# Patient Record
Sex: Male | Born: 1965 | Race: Black or African American | Hispanic: No | Marital: Married | Smoking: Former smoker
Health system: Southern US, Community
[De-identification: ages and names within clinical notes are randomized; demographics above are authoritative.]

## PROBLEM LIST (undated history)

## (undated) DIAGNOSIS — K219 Gastro-esophageal reflux disease without esophagitis: Secondary | ICD-10-CM

## (undated) DIAGNOSIS — G43909 Migraine, unspecified, not intractable, without status migrainosus: Secondary | ICD-10-CM

## (undated) DIAGNOSIS — G4733 Obstructive sleep apnea (adult) (pediatric): Secondary | ICD-10-CM

## (undated) DIAGNOSIS — I10 Essential (primary) hypertension: Secondary | ICD-10-CM

## (undated) DIAGNOSIS — I2510 Atherosclerotic heart disease of native coronary artery without angina pectoris: Secondary | ICD-10-CM

## (undated) HISTORY — PX: HERNIA REPAIR: SHX51

## (undated) HISTORY — PX: OTHER SURGICAL HISTORY: SHX169

## (undated) HISTORY — PX: BILATERAL CARPAL TUNNEL RELEASE: SHX6508

## (undated) HISTORY — PX: CARDIAC CATHETERIZATION: SHX172

## (undated) SURGERY — REPAIR, HERNIA, INGUINAL, BILATERAL, LAPAROSCOPIC
Anesthesia: General | Laterality: Bilateral

---

## 2001-09-19 ENCOUNTER — Emergency Department (HOSPITAL_COMMUNITY): Admission: EM | Admit: 2001-09-19 | Discharge: 2001-09-19 | Payer: Self-pay | Admitting: Emergency Medicine

## 2001-09-19 ENCOUNTER — Encounter: Payer: Self-pay | Admitting: Emergency Medicine

## 2004-04-03 ENCOUNTER — Other Ambulatory Visit: Payer: Self-pay

## 2004-04-04 ENCOUNTER — Other Ambulatory Visit: Payer: Self-pay

## 2005-11-03 ENCOUNTER — Emergency Department: Payer: Self-pay | Admitting: Emergency Medicine

## 2007-05-12 ENCOUNTER — Ambulatory Visit: Payer: Self-pay | Admitting: Family Medicine

## 2008-10-05 ENCOUNTER — Ambulatory Visit: Payer: Self-pay | Admitting: Internal Medicine

## 2009-10-29 ENCOUNTER — Emergency Department: Payer: Self-pay | Admitting: Unknown Physician Specialty

## 2010-01-24 ENCOUNTER — Emergency Department: Payer: Self-pay | Admitting: Emergency Medicine

## 2010-07-20 ENCOUNTER — Emergency Department: Payer: Self-pay | Admitting: Emergency Medicine

## 2010-09-18 ENCOUNTER — Ambulatory Visit: Payer: Self-pay | Admitting: Internal Medicine

## 2010-09-26 ENCOUNTER — Ambulatory Visit: Payer: Self-pay | Admitting: Internal Medicine

## 2012-06-18 LAB — COMPREHENSIVE METABOLIC PANEL
Albumin: 3.3 g/dL — ABNORMAL LOW (ref 3.4–5.0)
Alkaline Phosphatase: 91 U/L (ref 50–136)
Anion Gap: 11 (ref 7–16)
BUN: 20 mg/dL — ABNORMAL HIGH (ref 7–18)
Bilirubin,Total: 0.5 mg/dL (ref 0.2–1.0)
Calcium, Total: 8.4 mg/dL — ABNORMAL LOW (ref 8.5–10.1)
Chloride: 109 mmol/L — ABNORMAL HIGH (ref 98–107)
Co2: 25 mmol/L (ref 21–32)
Creatinine: 1.56 mg/dL — ABNORMAL HIGH (ref 0.60–1.30)
EGFR (African American): >60
EGFR (Non-African Amer.): 52 — ABNORMAL LOW
Glucose: 202 mg/dL — ABNORMAL HIGH (ref 65–99)
Osmolality: 297 (ref 275–301)
Potassium: 3.9 mmol/L (ref 3.5–5.1)
SGOT(AST): 24 U/L (ref 15–37)
SGPT (ALT): 24 U/L (ref 12–78)
Sodium: 145 mmol/L (ref 136–145)
Total Protein: 6.5 g/dL (ref 6.4–8.2)

## 2012-06-18 LAB — CBC
HCT: 42.4 % (ref 40.0–52.0)
HGB: 14.4 g/dL (ref 13.0–18.0)
MCH: 27 pg (ref 26.0–34.0)
MCHC: 34 g/dL (ref 32.0–36.0)
MCV: 79 fL — ABNORMAL LOW (ref 80–100)
Platelet: 222 10*3/uL (ref 150–440)
RBC: 5.34 10*6/uL (ref 4.40–5.90)
RDW: 13.9 % (ref 11.5–14.5)
WBC: 14.9 10*3/uL — ABNORMAL HIGH (ref 3.8–10.6)

## 2012-06-18 LAB — PROTIME-INR
INR: 1.1
Prothrombin Time: 14.3 secs (ref 11.5–14.7)

## 2012-06-18 LAB — CK TOTAL AND CKMB (NOT AT ARMC)
CK, Total: 145 U/L (ref 35–232)
CK-MB: 2 ng/mL (ref 0.5–3.6)

## 2012-06-19 ENCOUNTER — Inpatient Hospital Stay: Payer: Self-pay | Admitting: Internal Medicine

## 2012-06-19 LAB — URINALYSIS, COMPLETE
Bacteria: NONE SEEN
Bilirubin,UR: NEGATIVE
Blood: NEGATIVE
Glucose,UR: 50 mg/dL (ref 0–75)
Hyaline Cast: 4
Ketone: NEGATIVE
Leukocyte Esterase: NEGATIVE
Nitrite: NEGATIVE
Ph: 5 (ref 4.5–8.0)
Protein: NEGATIVE
RBC,UR: 1 /HPF (ref 0–5)
Specific Gravity: 1.025 (ref 1.003–1.030)
Squamous Epithelial: NONE SEEN
WBC UR: 3 /HPF (ref 0–5)

## 2012-06-19 LAB — HEMOGLOBIN A1C: Hemoglobin A1C: 5.9 % (ref 4.2–6.3)

## 2012-06-20 DIAGNOSIS — R0602 Shortness of breath: Secondary | ICD-10-CM

## 2012-06-20 LAB — BASIC METABOLIC PANEL
Anion Gap: 7 (ref 7–16)
BUN: 11 mg/dL (ref 7–18)
Calcium, Total: 8.7 mg/dL (ref 8.5–10.1)
Chloride: 108 mmol/L — ABNORMAL HIGH (ref 98–107)
Co2: 26 mmol/L (ref 21–32)
Creatinine: 0.83 mg/dL (ref 0.60–1.30)
EGFR (African American): >60
EGFR (Non-African Amer.): >60
Glucose: 105 mg/dL — ABNORMAL HIGH (ref 65–99)
Osmolality: 281 (ref 275–301)
Potassium: 3.7 mmol/L (ref 3.5–5.1)
Sodium: 141 mmol/L (ref 136–145)

## 2012-06-20 LAB — LIPID PANEL
Cholesterol: 132 mg/dL (ref 0–200)
HDL Cholesterol: 32 mg/dL — ABNORMAL LOW (ref 40–60)
Ldl Cholesterol, Calc: 66 mg/dL (ref 0–100)
Triglycerides: 171 mg/dL (ref 0–200)
VLDL Cholesterol, Calc: 34 mg/dL (ref 5–40)

## 2012-06-20 LAB — CBC WITH DIFFERENTIAL/PLATELET
Basophil #: 0 10*3/uL (ref 0.0–0.1)
Basophil %: 0.4 %
Eosinophil #: 0.3 10*3/uL (ref 0.0–0.7)
Eosinophil %: 4.1 %
HCT: 36.4 % — ABNORMAL LOW (ref 40.0–52.0)
HGB: 12.6 g/dL — ABNORMAL LOW (ref 13.0–18.0)
Lymphocyte #: 3.3 10*3/uL (ref 1.0–3.6)
Lymphocyte %: 43.8 %
MCH: 27.4 pg (ref 26.0–34.0)
MCHC: 34.5 g/dL (ref 32.0–36.0)
MCV: 80 fL (ref 80–100)
Monocyte #: 0.5 x10 3/mm (ref 0.2–1.0)
Monocyte %: 5.9 %
Neutrophil #: 3.5 10*3/uL (ref 1.4–6.5)
Neutrophil %: 45.8 %
Platelet: 195 10*3/uL (ref 150–440)
RBC: 4.58 10*6/uL (ref 4.40–5.90)
RDW: 13.7 % (ref 11.5–14.5)
WBC: 7.6 10*3/uL (ref 3.8–10.6)

## 2012-12-12 DIAGNOSIS — R413 Other amnesia: Secondary | ICD-10-CM | POA: Insufficient documentation

## 2012-12-12 DIAGNOSIS — R479 Unspecified speech disturbances: Secondary | ICD-10-CM

## 2012-12-12 DIAGNOSIS — F809 Developmental disorder of speech and language, unspecified: Secondary | ICD-10-CM | POA: Insufficient documentation

## 2014-01-05 ENCOUNTER — Emergency Department: Payer: Self-pay | Admitting: Emergency Medicine

## 2014-01-05 LAB — URINALYSIS, COMPLETE
BACTERIA: NONE SEEN
Bilirubin,UR: NEGATIVE
GLUCOSE, UR: NEGATIVE mg/dL (ref 0–75)
Ketone: NEGATIVE
Leukocyte Esterase: NEGATIVE
NITRITE: NEGATIVE
Ph: 5 (ref 4.5–8.0)
Protein: NEGATIVE
RBC,UR: 3 /HPF (ref 0–5)
SQUAMOUS EPITHELIAL: NONE SEEN
Specific Gravity: 1.026 (ref 1.003–1.030)

## 2014-01-05 LAB — CBC WITH DIFFERENTIAL/PLATELET
Basophil #: 0 10*3/uL (ref 0.0–0.1)
Basophil %: 0.6 %
Eosinophil #: 0.2 10*3/uL (ref 0.0–0.7)
Eosinophil %: 2.8 %
HCT: 40.8 % (ref 40.0–52.0)
HGB: 13.6 g/dL (ref 13.0–18.0)
Lymphocyte #: 2.9 10*3/uL (ref 1.0–3.6)
Lymphocyte %: 39.5 %
MCH: 26.6 pg (ref 26.0–34.0)
MCHC: 33.4 g/dL (ref 32.0–36.0)
MCV: 80 fL (ref 80–100)
Monocyte #: 0.7 x10 3/mm (ref 0.2–1.0)
Monocyte %: 9.1 %
Neutrophil #: 3.5 10*3/uL (ref 1.4–6.5)
Neutrophil %: 48 %
Platelet: 203 10*3/uL (ref 150–440)
RBC: 5.13 10*6/uL (ref 4.40–5.90)
RDW: 13.7 % (ref 11.5–14.5)
WBC: 7.4 10*3/uL (ref 3.8–10.6)

## 2014-01-05 LAB — TROPONIN I: Troponin-I: <0.02 ng/mL

## 2014-01-05 LAB — COMPREHENSIVE METABOLIC PANEL
ALBUMIN: 3.7 g/dL (ref 3.4–5.0)
AST: 17 U/L (ref 15–37)
Alkaline Phosphatase: 105 U/L
Anion Gap: 3 — ABNORMAL LOW (ref 7–16)
BUN: 11 mg/dL (ref 7–18)
Bilirubin,Total: 0.6 mg/dL (ref 0.2–1.0)
CHLORIDE: 105 mmol/L (ref 98–107)
Calcium, Total: 9 mg/dL (ref 8.5–10.1)
Co2: 31 mmol/L (ref 21–32)
Creatinine: 0.93 mg/dL (ref 0.60–1.30)
EGFR (Non-African Amer.): >60
Glucose: 108 mg/dL — ABNORMAL HIGH (ref 65–99)
Osmolality: 277 (ref 275–301)
Potassium: 3.8 mmol/L (ref 3.5–5.1)
SGPT (ALT): 24 U/L (ref 12–78)
SODIUM: 139 mmol/L (ref 136–145)
TOTAL PROTEIN: 7.3 g/dL (ref 6.4–8.2)

## 2014-12-07 NOTE — Discharge Summary (Signed)
PATIENT NAME:  Matthew [name], Matthew [name] MR#:  [id number] DATE OF BIRTH:  [birth year]  DATE OF ADMISSION:  06/19/2012 DATE OF DISCHARGE:  06/20/2012  DISCHARGE DIAGNOSIS: Slurred speech and right-sided weakness which are resolved and transient, likely due to transient ischemic attack versus stress reaction.   DISCHARGE MEDICATIONS: Aspirin 81 mg daily.   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 The patient did have a work-up as an outpatient with an allergist in [place] and uses an EpiPen.  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   CONSULTATIONS: Physical Therapy and Speech Therapy.   LABORATORY, DIAGNOSTIC AND RADIOLOGICAL DATA: Hemoglobin A1c 5.9.  Sodium 145, potassium 3.9, chloride 109, bicarbonate 25, BUN 20, creatinine 1.56 on admission and glucose 202. WBC 14.9 on admission, hemoglobin 14.3, hematocrit 42.4, platelets 222. INR 1.9. CT of the head: No acute abnormality. Chest x-ray showed no acute abnormality. MRA of the brain is negative for any acute changes. MRA of the brain showed no acute intracranial abnormalities, no infarct. Ultrasound of carotids showed no evidence of hemodynamically significant stenosis. No significant atherosclerotic disease. LDL 66, HDL 32, triglycerides 171. Creatinine this morning is 0.83. WBC is 7.6 today, hemoglobin 12.6. The patient's echocardiogram shows normal systolic function, normal left ventricular wall motion, and normal right ventricular wall motion. The patient's ejection fraction is 56%.   HOSPITAL COURSE: The patient is a 49 year old male with no past medical history who is relatively in healthy condition and does exercise regularly, came in because of slurred speech and right-sided weakness. Look at the History and Physical for full details. The patient had three miles jogging and then came home. He was feeling hot and went into the shower. The patient had like a syncopal  episode there, and after that the patient was noted to have drooling from the right side of the mouth and right-sided weakness and right facial numbness. The patient's CAT scan was negative in the ER. Neurology was consulted in the ER, and the patient was consulted with the specialist on call in the ER, and he was a candidate for TPA. 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 Speech Therapy and Physical Therapy were consulted. MRI of the brain and carotid ultrasound and echocardiogram were done. They are normal. The patient neurologically remained stable.  The patient's weakness and dysarthria also resolved. The patient's weakness on the right side has resolved completely, and he is able to communicate. The patient had a little bit of slowing when talking, and yesterday he was slow to respond; but today he is completely normal and able to communicate. His speech is more clear, no facial numbness, no weakness of the right or left hand. The patient was seen by Physical Therapy and Speech Therapy. The patient ambulated 250 feet with a rolling walker, and Physical Therapy recommended home with outpatient services. He was also seen by a speech therapist, and she also recommends outpatient speech therapy. The patient says that he is under a lot of stress, has a full-time job and also working. He is studying also in theology. He also has a Charity fundraiser job. He is under a lot of stress, so it can be stress-induced changes as well. If the symptoms worsen, he needs to have a work-up  with MRI of the spine further evaluating for any degenerative disease. The patient says that he follows up with [facility] as needed, so we are trying to set up an appointment with [facility] with outpatient speech therapy and also outpatient physical therapy. The patient  says that he does take creatinine supplements for his exercise. His creatinine was a little bit up, and right now it is normal; and also leukocytosis has improved.   CONDITION: Stable.   DISCHARGE VITAL SIGNS:  Temperature is 97.9, pulse is 54, blood pressure is 126/80, saturating 97% on room air.   TIME SPENT ON DISCHARGE PREPARATION: More than 30 minutes.  ____________________________ Epifanio Lesches, MD sk:cbb D: 06/20/2012 11:07:42 ET T: 06/20/2012 11:24:47 ET JOB#: [id number]  cc: Epifanio Lesches, MD, <Dictator> [facility] Epifanio Lesches MD ELECTRONICALLY SIGNED 07/01/2012 19:47

## 2014-12-07 NOTE — H&P (Signed)
PATIENT NAME:  Matthew Matthew [name], Matthew [name] MR#:  [id number] DATE OF BIRTH:  07/29/1966  DATE OF ADMISSION:  06/19/2012  PRIMARY CARE PHYSICIAN: None local.   CHIEF COMPLAINT: Slurry speech and right-sided weakness.   HISTORY OF PRESENT ILLNESS: The patient is a 49 year old male with a past medical history of coronary artery disease and carpal tunnel repair who is presenting to the ER with a chief complaint of dysarthria and right-sided weakness. The patient was in his usual state of health until yesterday evening. The patient went jogging yesterday evening and following that he changes his clothes and went to take shower and could not stand while taking a shower and he fell. The patient was having dizziness and noticed difficulty in speaking. Sister-in-law has noted drooling from the right side of the mouth. 9-1-1 was called and the patient was brought into the ER. The patient was having right facial numbness and right-sided weakness. CAT scan of the head was done which showed no acute findings. Neurology consult was done by the ER. After Neurology evaluation they recommended TPA which was refused by the patient given the side effects. Neurologist has recommended to admit the patient for close monitoring and keep him n.p.o. until speech therapy evaluation. Stroke work-up was also recommended. During my examination, the patient is still having dysarthria but he is reporting that it is clearing up. He thinks his speech and thinking process is sluggish. No similar complaints in the past. Wife at bedside. The patient denies any stressors. Denies any chest pain or shortness of breath. Denies any abdominal pain, nausea or vomiting. No similar complaints in the past.   PAST MEDICAL HISTORY: Past medical history probably coronary artery disease as patient had cardiac cath x4.  PAST SURGICAL HISTORY:  1. Cardiac catheterization x4.  2. Carpal tunnel repair.   ALLERGIES: No known drug allergies.   HOME MEDICATIONS:  EpiPen 2-pack on an as needed basis.   PSYCHOSOCIAL HISTORY: The patient lives at home with wife. Denies any smoking or alcohol. Denies any street drugs.    FAMILY HISTORY: The patient's brother deceased at a young age with heart attack. Heart problems are in his family. The patient was adopted. Does not know much about his parents.  REVIEW OF SYSTEMS: CONSTITUTIONAL: Patient denies any fever, fatigue. Complaining of weakness. Denies any weight gain or weight loss. EYES: The patient is denying any redness, inflammation, glaucoma, cataracts. ENT: Denies tinnitus, ear pain, hearing loss, postnasal drip. RESPIRATORY: Patient denies cough, wheezing, hemoptysis, or dyspnea. Denies any COPD. CARDIOVASCULAR: Denies chest pain, orthopnea. Denies any varicose veins. GI: Patient denies any nausea, vomiting, diarrhea, abdominal pain. Denies any hematemesis, melena ulcers. Denies any irritable bowel syndrome. ENDOCRINOLOGY: Denies polyuria, nocturia, thyroid problems. INTEGUMENTARY: No acne, rash. MUSCULOSKELETAL: The patient denies any pain in the back. NEUROLOGIC: Positive dysarthria. Positive right-sided weakness, right facial numbness. Cerebellar signs are negative. Negative dysdiadochokinesia. Finger-nose test is intact. Reflexes are 2+. Cranial nerves II through XII are intact. As the patient is feeling weak on the right side, gait was not checked. PSYCH: Patient denies any bipolar disorder, depression, or anxiety.   PHYSICAL EXAMINATION:   VITAL SIGNS: Temperature 97.7, pulse 63, respiratory rate 20, blood pressure 117/75.   GENERAL APPEARANCE: Not in acute distress. Answering questions appropriately but positive dysarthria. No deviation of the mouth. Not in acute distress.   HEENT: Normocephalic, atraumatic. Pupils are equally reacting to light and accommodation.   NECK: Supple. No JVD. No masses.   LUNGS: Clear to auscultation bilaterally.  CARDIOVASCULAR: S1, S2, normal. Regular rate and rhythm.    GI: Soft. Bowel sounds are positive in all four quadrants. Nontender, nondistended.  NEUROLOGIC: Awake, alert, and oriented x3. Dysarthria. No deviation of the mouth. Negative cerebellar signs. Negative dysdiadochokinesia. Motor right upper extremity and lower extremity 3 out of 5; left upper and lower extremity intact. Decreased sensation on the right upper and lower extremity.   SKIN: No lesions. No rashes. No jaundice.   EXTREMITIES: No pedal edema. No cyanosis. No clubbing.  ASSESSMENT AND PLAN:  1. Acute CVA. Initial CAT scan of the head is negative status post Neurology evaluation. The patient refused TPA. Will continue aspirin. The patient is made n.p.o. for speech pathology evaluation. Check fasting lipid panel. Will consider statin depending on the lipid panel results. Will get stroke workup with MRI of the brain, MRA of the brain and neck, carotid Doppler's and 2-D echocardiogram. Neurology consult will be placed. PT and OT consult is placed.  2. Possible history of coronary artery disease as the patient has had cardiac cath x4 with blockages but no interventions done.  3. History of carpal tunnel syndrome, status post repair, stable. No interventions are needed at this time. 4. We will provide GI and DVT prophylaxis.   CODE STATUS: The patient is a FULL CODE.   The diagnosis and plan of care was discussed in detail with the patient and his wife at bedside. They both are understanding of the plan.   TOTAL TIME SPENT ON ADMISSION: 55 minutes.   ____________________________ Nicholes Mango, MD ag:drc D: 06/19/2012 05:57:17 ET T: 06/19/2012 08:34:50 ET JOB#: [id number]  cc: Nicholes Mango, MD, <Dictator> Nicholes Mango MD ELECTRONICALLY SIGNED 06/29/2012 0:06

## 2014-12-11 NOTE — Consult Note (Signed)
Referring Physician:  Tommie Raymond   Primary Care Physician:  Tommie Raymond [facility], [facility] [place], [place], [place] [address], [phone]  Reason for Consult: Admit Date: 05-Jan-2014  Chief Complaint: face numbness  Reason for Consult: CVA   History of Present Illness: History of Present Illness:   49 yo RHD M presents to [facility] secondary to acute onset of facial numbness and heaviness.  Pt states that this started at 1400 and that he also had slurred speech and problems getting his words out.  Pt denied any vision changes, arm/leg weakness or numbness.  This happened once before in Oct 2013 and resolved within a few hours too.  No headache currently either.  ROS:  General denies complaints   HEENT no complaints   Lungs no complaints   Cardiac no complaints   GI no complaints   GU no complaints   Musculoskeletal no complaints   Extremities no complaints   Skin no complaints   Neuro numbness/tingling   Endocrine no complaints   Psych no complaints   Past Medical/Surgical Hx:  Degenerative disc disorder:   2 Cardiac caths:   Foot Surgery - Left:   Hand Surgery - Left:   Past Medical/ Surgical Hx:  Past Medical History as above   Past Surgical History as above   Home Medications: Medication Instructions Last Modified Date/Time  aspirin 81 mg oral tablet 1 tab(s) orally once a day 19-May-15 15:58  garcina [place]  19-May-15 15:58  green tea extract  19-May-15 15:58   Allergies:  No Known Allergies:   Allergies:  Allergies NKDA   Social/Family History: Employment Status: currently employed  Lives With: significant other  Living Arrangements: house  Social History: no tob, no EtOH, no illicits, has high stress job as Librarian, academic  Family History: no Stroke, + CAD   Physical Exam: General: nl weight, NAD, anxious  HEENT: normocephalic, sclera nonicteric, oropharynx clear  Neck: supple, no JVD, no bruits   Chest: CTA B, no wheezing, good movement  Cardiac: RRR, no murmurs, no edema, 2+ pulses  Extremities: no C/C/E, FROM   Neurologic Exam: Mental Status: alert and oriented x 3, follows complex commands, anxious with slow deliberate speech with  mild stuttering  Cranial Nerves: PERRLA, EOMI, nl VF, face symmetric, tongue midline, shoulder shrug equal  Motor Exam: 5/5 B normal, tone, no tremor  Deep Tendon Reflexes: 2+/4 B, plantars downgoing B, no Hoffman  Sensory Exam: pinprick, temperature, and vibration intact B except slightly decreased on face  Coordination: FTN and HTS WNL, nl RAM   Lab Results: Hepatic:  19-May-15 15:30   Bilirubin, Total 0.6  Alkaline Phosphatase 105 (45-117 NOTE: New Reference Range 07/10/13)  SGPT (ALT) 24  SGOT (AST) 17  Total Protein, Serum 7.3  Albumin, Serum 3.7  Routine Chem:  19-May-15 15:30   Glucose, Serum  108  BUN 11  Creatinine (comp) 0.93  Sodium, Serum 139  Potassium, Serum 3.8  Chloride, Serum 105  CO2, Serum 31  Calcium (Total), Serum 9.0  Osmolality (calc) 277  eGFR (African American) >60  eGFR (Non-African American) >60 (eGFR values <66m/min/1.73 m2 may be an indication of chronic kidney disease (CKD). Calculated eGFR is useful in patients with stable renal function. The eGFR calculation will not be reliable in acutely ill patients when serum creatinine is changing rapidly. It is not useful in  patients on dialysis. The eGFR calculation may not be applicable to patients at the low  and high extremes of body sizes, pregnant women, and vegetarians.)  Anion Gap  3  Cardiac:  19-May-15 15:30   Troponin I < 0.02 (0.00-0.05 0.05 ng/mL or less: NEGATIVE  Repeat testing in 3-6 hrs  if clinically indicated. >0.05 ng/mL: POTENTIAL  MYOCARDIAL INJURY. Repeat  testing in 3-6 hrs if  clinically indicated. NOTE: An increase or decrease  of 30% or more on serial  testing suggests a  clinically important change)  Routine UA:   19-May-15 15:30   Color (UA) Yellow  Clarity (UA) Clear  Glucose (UA) Negative  Bilirubin (UA) Negative  Ketones (UA) Negative  Specific Gravity (UA) 1.026  Blood (UA) 1+  pH (UA) 5.0  Protein (UA) Negative  Nitrite (UA) Negative  Leukocyte Esterase (UA) Negative (Result(s) reported on 05 Jan 2014 at 04:16PM.)  RBC (UA) 3 /HPF  WBC (UA) <1 /HPF  Bacteria (UA) NONE SEEN  Epithelial Cells (UA) NONE SEEN  Mucous (UA) PRESENT (Result(s) reported on 05 Jan 2014 at 04:16PM.)  Routine Hem:  19-May-15 15:30   WBC (CBC) 7.4  RBC (CBC) 5.13  Hemoglobin (CBC) 13.6  Hematocrit (CBC) 40.8  Platelet Count (CBC) 203  MCV 80  MCH 26.6  MCHC 33.4  RDW 13.7  Neutrophil % 48.0  Lymphocyte % 39.5  Monocyte % 9.1  Eosinophil % 2.8  Basophil % 0.6  Neutrophil # 3.5  Lymphocyte # 2.9  Monocyte # 0.7  Eosinophil # 0.2  Basophil # 0.0 (Result(s) reported on 05 Jan 2014 at 04:03PM.)   Radiology Results: CT:    19-May-15 15:27, CT Head Without Contrast  CT Head Without Contrast   REASON FOR EXAM:    slurred speech  COMMENTS:       PROCEDURE: CT  - CT HEAD WITHOUT CONTRAST  - Jan 05 2014  3:27PM     CLINICAL DATA:  Slurred speech    EXAM:  CT HEAD WITHOUT CONTRAST    TECHNIQUE:  Contiguous axial images were obtained from the base of the skull  through the vertex without intravenous contrast.    COMPARISON:  None.  FINDINGS:  There is no evidence of mass effect, midline shift or extra-axial  fluid collections. There is no evidence of a space-occupying lesion  or intracranial hemorrhage. There is no evidence of a cortical-based  area of acute infarction.    The ventricles and sulci are appropriate for the patient's age. The  basal cisterns are patent.    Visualized portions of the orbits are unremarkable. The visualized  portions of the paranasal sinuses and mastoid air cells are  unremarkable.    The osseous structures are unremarkable. 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   IMPRESSION:  Normal CT of the brain without intravenous contrast.      Electronically Signed    By: Kathreen Devoid    On: 01/05/2014 15:28         Verified By: Jennette Banker, M.D., MD   Radiology Impression: Radiology Impression: CT of head personally reviewed by me and completely normal   Impression/Recommendations: Recommendations:   labs reviewed by me  d/w ER physician   Facial numbness-  this bilateral and nondiscript;  this does not follow normal neurologic pathways and mechanisms and believe that this is secondary to anxiety or some other conversion disorder.   CAD-  stable Hx of allergic reaction-  this episode was similar, will watch for hives MRI of brain at descretion  of admitting physician would not place pt on stroke protocol  ok to continue pt's home ASA 21m daily will follow imaging tomorrow watch for allergic reaction  Electronic Signatures: SJamison Neighbor(MD)  (Signed 19-May-15 17:19)  Authored: REFERRING PHYSICIAN, Primary Care Physician, Consult, History of Present Illness, Review of Systems, PAST MEDICAL/SURGICAL HISTORY, HOME MEDICATIONS, ALLERGIES, Social/Family History, Physical Exam-, LAB RESULTS, RADIOLOGY RESULTS, Recommendations   Last Updated: 19-May-15 17:19 by SJamison Neighbor(MD)

## 2015-02-28 ENCOUNTER — Ambulatory Visit
Admission: RE | Admit: 2015-02-28 | Discharge: 2015-02-28 | Disposition: A | Discharge status: Home / Self Care | Payer: PRIVATE HEALTH INSURANCE | Source: Ambulatory Visit | Attending: Gastroenterology | Admitting: Gastroenterology

## 2015-02-28 ENCOUNTER — Encounter
Admission: RE | Disposition: A | Discharge status: Home / Self Care | Payer: Self-pay | Source: Ambulatory Visit | Attending: Gastroenterology

## 2015-02-28 ENCOUNTER — Ambulatory Visit: Payer: PRIVATE HEALTH INSURANCE | Admitting: Anesthesiology

## 2015-02-28 DIAGNOSIS — I2510 Atherosclerotic heart disease of native coronary artery without angina pectoris: Secondary | ICD-10-CM | POA: Insufficient documentation

## 2015-02-28 DIAGNOSIS — Z7982 Long term (current) use of aspirin: Secondary | ICD-10-CM | POA: Insufficient documentation

## 2015-02-28 DIAGNOSIS — D123 Benign neoplasm of transverse colon: Secondary | ICD-10-CM | POA: Insufficient documentation

## 2015-02-28 DIAGNOSIS — K219 Gastro-esophageal reflux disease without esophagitis: Secondary | ICD-10-CM | POA: Diagnosis not present

## 2015-02-28 DIAGNOSIS — K648 Other hemorrhoids: Secondary | ICD-10-CM | POA: Diagnosis not present

## 2015-02-28 DIAGNOSIS — K644 Residual hemorrhoidal skin tags: Secondary | ICD-10-CM | POA: Diagnosis not present

## 2015-02-28 DIAGNOSIS — Z87891 Personal history of nicotine dependence: Secondary | ICD-10-CM | POA: Insufficient documentation

## 2015-02-28 DIAGNOSIS — Z8673 Personal history of transient ischemic attack (TIA), and cerebral infarction without residual deficits: Secondary | ICD-10-CM | POA: Insufficient documentation

## 2015-02-28 DIAGNOSIS — Z1211 Encounter for screening for malignant neoplasm of colon: Secondary | ICD-10-CM | POA: Diagnosis not present

## 2015-02-28 HISTORY — PX: COLONOSCOPY WITH PROPOFOL: SHX5780

## 2015-02-28 HISTORY — DX: Atherosclerotic heart disease of native coronary artery without angina pectoris: I25.10

## 2015-02-28 SURGERY — COLONOSCOPY WITH PROPOFOL
Anesthesia: General

## 2015-02-28 MED ORDER — SODIUM CHLORIDE 0.9 % IV SOLN
INTRAVENOUS | Status: DC
  Administered 2015-02-28: 1000 mL via INTRAVENOUS

## 2015-02-28 MED ORDER — PROPOFOL INFUSION 10 MG/ML OPTIME
INTRAVENOUS | Status: DC | PRN
  Administered 2015-02-28: 120 ug/kg/min via INTRAVENOUS

## 2015-02-28 MED ORDER — FENTANYL CITRATE (PF) 100 MCG/2ML IJ SOLN
INTRAMUSCULAR | Status: DC | PRN
  Administered 2015-02-28: 50 ug via INTRAVENOUS

## 2015-02-28 MED ORDER — PROPOFOL 10 MG/ML IV BOLUS
INTRAVENOUS | Status: DC | PRN
  Administered 2015-02-28: 89 mg via INTRAVENOUS

## 2015-02-28 MED ORDER — MIDAZOLAM HCL 2 MG/2ML IJ SOLN
INTRAMUSCULAR | Status: DC | PRN
  Administered 2015-02-28: 1 mg via INTRAVENOUS

## 2015-02-28 MED ORDER — SODIUM CHLORIDE 0.9 % IV SOLN: INTRAVENOUS | Status: DC

## 2015-02-28 NOTE — Discharge Instructions (Signed)

## 2015-02-28 NOTE — Anesthesia Postprocedure Evaluation (Signed)
  Anesthesia Post-op Note  Patient: Matthew [name]  Procedure(s) Performed: Procedure(s): COLONOSCOPY WITH PROPOFOL (N/A)  Anesthesia type:General  Patient location: PACU  Post pain: Pain level controlled  Post assessment: Post-op Vital signs reviewed, Patient's Cardiovascular Status Stable, Respiratory Function Stable, Patent Airway and No signs of Nausea or vomiting  Post vital signs: Reviewed and stable  Last Vitals:  Filed Vitals:   02/28/15 1015  BP:   Pulse:   Temp: 36 C  Resp:     Level of consciousness: awake, alert  and patient cooperative  Complications: No apparent anesthesia complications

## 2015-02-28 NOTE — Op Note (Signed)
[facility] Patient Name: Matthew [name] Procedure Date: 02/28/2015 9:36 AM MRN: [id number] Account #: [id number] Date of Birth: 04-07-1966 Admit Type: Outpatient Age: 49 Room: [facility] ENDO ROOM 4 Gender: Male Note Status: Finalized Procedure:         Colonoscopy Indications:       Screening in patient at increased risk: Family history of                     1st-degree relative with colorectal cancer before age 68                     years, This is the patient's first colonoscopy Patient Profile:   This is a 49 year old male. Providers:         Gerrit Heck. Rayann Heman, MD Referring MD:      Arlie Solomons. White (Referring MD), Marguerita Merles, MD                     (Referring MD) Medicines:         Propofol per Anesthesia Complications:     No immediate complications. Procedure:         Pre-Anesthesia Assessment:                    - Prior to the procedure, a History and Physical was                     performed, and patient medications, allergies and                     sensitivities were reviewed. The patient's tolerance of                     previous anesthesia was reviewed.                    After obtaining informed consent, the colonoscope was                     passed under direct vision. Throughout the procedure, the                     patient's blood pressure, pulse, and oxygen saturations                     were monitored continuously. The Colonoscope was                     introduced through the anus and advanced to the the cecum,                     identified by appendiceal orifice and ileocecal valve. The                     colonoscopy was performed without difficulty. The patient                     tolerated the procedure well. The quality of the bowel                     preparation was excellent. Findings:      The perianal exam findings include non-thrombosed external hemorrhoids.      A 3 mm polyp was found in the proximal  transverse colon. The polyp was  sessile. The polyp was removed with a jumbo cold forceps. Resection and       retrieval were complete.      Internal hemorrhoids were found during retroflexion. The hemorrhoids       were Grade I (internal hemorrhoids that do not prolapse). Impression:        - Non-thrombosed external hemorrhoids found on perianal                     exam.                    - One 3 mm polyp in the proximal transverse colon.                     Resected and retrieved.                    - Internal hemorrhoids. Recommendation:    - Observe patient in GI recovery unit.                    - High fiber diet.                    - Continue present medications.                    - Await pathology results.                    - Repeat colonoscopy for surveillance based on pathology                     results.                    - Return to referring physician.                    - The findings and recommendations were discussed with the                     patient.                    - The findings and recommendations were discussed with the                     patient.                    - The findings and recommendations were discussed with the                     patient's family. Procedure Code(s): --- Professional ---                    [phone], Colonoscopy, flexible; with biopsy, single or                     multiple CPT copyright 2014 American Medical Association. All rights reserved. The codes documented in this report are preliminary and upon coder review may  be revised to meet current compliance requirements. Mellody Life, MD 02/28/2015 10:13:10 AM This report has been signed electronically. Number of Addenda: 0 Note Initiated On: 02/28/2015 9:36 AM Scope Withdrawal Time: 0 hours 15 minutes 5 seconds  Total Procedure Duration: 0 hours 25 minutes 29 seconds       [facility]

## 2015-02-28 NOTE — Anesthesia Preprocedure Evaluation (Addendum)
Anesthesia Evaluation  Patient identified by MRN, date of birth, ID band Patient awake    Reviewed: Allergy & Precautions, NPO status , Patient's Chart, lab work & pertinent test results  History of Anesthesia Complications Negative for: history of anesthetic complications  Airway Mallampati: II  TM Distance: >3 FB Neck ROM: Full    Dental  (+) Teeth Intact   Pulmonary former smoker,          Cardiovascular + CAD     Neuro/Psych CVA, No Residual Symptoms    GI/Hepatic GERD- (OTC)  Medicated,  Endo/Other    Renal/GU      Musculoskeletal   Abdominal   Peds  Hematology   Anesthesia Other Findings   Reproductive/Obstetrics                            Anesthesia Physical Anesthesia Plan  ASA: II  Anesthesia Plan: General   Post-op Pain Management:    Induction: Intravenous  Airway Management Planned: Nasal Cannula  Additional Equipment:   Intra-op Plan:   Post-operative Plan:   Informed Consent: I have reviewed the patients History and Physical, chart, labs and discussed the procedure including the risks, benefits and alternatives for the proposed anesthesia with the patient or authorized representative who has indicated his/her understanding and acceptance.     Plan Discussed with:   Anesthesia Plan Comments:         Anesthesia Quick Evaluation

## 2015-02-28 NOTE — H&P (Signed)
  Primary Care Physician:  Marguerita Merles, MD  Pre-Procedure History & Physical: HPI:  Matthew [name] is a 49 y.o. male is here for an colonoscopy.   Past Medical History  Diagnosis Date  . Coronary artery disease     Past Surgical History  Procedure Laterality Date  . Cardiac catheterization    . Bilateral carpal tunnel release    . Left hand surgery      Prior to Admission medications   Medication Sig Start Date End Date Taking? Authorizing Provider  aspirin EC 81 MG tablet Take 81 mg by mouth daily.   Yes Historical Provider, MD  meloxicam (MOBIC) 15 MG tablet Take 15 mg by mouth daily.   Yes Historical Provider, MD    Allergies as of 02/17/2015  . (Not on File)    History reviewed. No pertinent family history.  History   Social History  . Marital Status: Married    Spouse Name: N/A  . Number of Children: N/A  . Years of Education: N/A   Occupational History  . Not on file.   Social History Main Topics  . Smoking status: Former Research scientist (life sciences)  . Smokeless tobacco: Not on file  . Alcohol Use: Not on file  . Drug Use: Not on file  . Sexual Activity: Not on file   Other Topics Concern  . Not on file   Social History Narrative  . No narrative on file     Physical Exam: BP 140/95 mmHg  Pulse 54  Temp(Src) 97 F (36.1 C) (Tympanic)  Resp 17  Ht 5' 11.5" (1.816 m)  Wt 89.812 kg (198 lb)  BMI 27.23 kg/m2  SpO2 99% General:   Alert,  pleasant and cooperative in NAD Head:  Normocephalic and atraumatic. Neck:  Supple; no masses or thyromegaly. Lungs:  Clear throughout to auscultation.    Heart:  Regular rate and rhythm. Abdomen:  Soft, nontender and nondistended. Normal bowel sounds, without guarding, and without rebound.   Neurologic:  Alert and  oriented x4;  grossly normal neurologically.  Impression/Plan: Matthew [name] is here for an colonoscopy to be performed for high risk screening, fam hx colon ca  Risks, benefits, limitations, and  alternatives regarding  colonoscopy have been reviewed with the patient.  Questions have been answered.  All parties agreeable.   Josefine Class, MD  02/28/2015, 9:34 AM

## 2015-02-28 NOTE — Transfer of Care (Signed)
Immediate Anesthesia Transfer of Care Note  Patient: Matthew [name]  Procedure(s) Performed: Procedure(s): COLONOSCOPY WITH PROPOFOL (N/A)  Patient Location: PACU  Anesthesia Type:General  Level of Consciousness: sedated  Airway & Oxygen Therapy: Patient Spontanous Breathing  Post-op Assessment: Report given to RN  Post vital signs: Reviewed  Last Vitals:  Filed Vitals:   02/28/15 1015  BP:   Pulse:   Temp: 36 C  Resp:     Complications: No apparent anesthesia complications

## 2015-03-01 ENCOUNTER — Encounter: Payer: Self-pay | Admitting: Gastroenterology

## 2015-03-02 LAB — SURGICAL PATHOLOGY

## 2015-12-07 ENCOUNTER — Ambulatory Visit
Admission: EM | Admit: 2015-12-07 | Discharge: 2015-12-07 | Disposition: A | Discharge status: Home / Self Care | Payer: Managed Care, Other (non HMO) | Attending: Family Medicine | Admitting: Family Medicine

## 2015-12-07 ENCOUNTER — Emergency Department: Payer: Managed Care, Other (non HMO)

## 2015-12-07 ENCOUNTER — Encounter: Payer: Self-pay | Admitting: Medical Oncology

## 2015-12-07 ENCOUNTER — Encounter: Payer: Self-pay | Admitting: *Deleted

## 2015-12-07 ENCOUNTER — Emergency Department
Admission: EM | Admit: 2015-12-07 | Discharge: 2015-12-07 | Disposition: A | Discharge status: Home / Self Care | Payer: Managed Care, Other (non HMO) | Attending: Emergency Medicine | Admitting: Emergency Medicine

## 2015-12-07 DIAGNOSIS — I2510 Atherosclerotic heart disease of native coronary artery without angina pectoris: Secondary | ICD-10-CM | POA: Insufficient documentation

## 2015-12-07 DIAGNOSIS — K4090 Unilateral inguinal hernia, without obstruction or gangrene, not specified as recurrent: Secondary | ICD-10-CM

## 2015-12-07 DIAGNOSIS — N50812 Left testicular pain: Secondary | ICD-10-CM

## 2015-12-07 DIAGNOSIS — R319 Hematuria, unspecified: Secondary | ICD-10-CM

## 2015-12-07 DIAGNOSIS — Z87891 Personal history of nicotine dependence: Secondary | ICD-10-CM | POA: Diagnosis not present

## 2015-12-07 DIAGNOSIS — R52 Pain, unspecified: Secondary | ICD-10-CM

## 2015-12-07 LAB — URINALYSIS COMPLETE WITH MICROSCOPIC (ARMC ONLY)
BACTERIA UA: NONE SEEN
Bilirubin Urine: NEGATIVE
Glucose, UA: NEGATIVE mg/dL
Leukocytes, UA: NEGATIVE
Nitrite: NEGATIVE
PROTEIN: NEGATIVE mg/dL
SQUAMOUS EPITHELIAL / LPF: NONE SEEN
Specific Gravity, Urine: 1.023 (ref 1.005–1.030)
pH: 6 (ref 5.0–8.0)

## 2015-12-07 LAB — CHLAMYDIA/NGC RT PCR (ARMC ONLY)
CHLAMYDIA TR: NOT DETECTED
N gonorrhoeae: NOT DETECTED

## 2015-12-07 MED ORDER — DOCUSATE SODIUM 100 MG PO CAPS: ORAL_CAPSULE | ORAL | Status: DC

## 2015-12-07 MED ORDER — LEVOFLOXACIN 500 MG PO TABS: 500.0000 mg | ORAL_TABLET | Freq: Every day | ORAL | Status: AC

## 2015-12-07 MED ORDER — HYDROCODONE-ACETAMINOPHEN 5-325 MG PO TABS: ORAL_TABLET | ORAL | Status: DC

## 2015-12-07 NOTE — ED Notes (Signed)
Patient started having left inguinal pain symptoms 2 days ago. Patient works in [employer] and was recently lifting heavy boxes. No previous hernia history.

## 2015-12-07 NOTE — ED Notes (Signed)
Pt reports left sided testicular pain x 2 days without injury. Pt denies dysuria.

## 2015-12-07 NOTE — ED Provider Notes (Signed)
CSN: [id number]     Arrival date & time 12/07/15  1001 History   First MD Initiated Contact with Patient 12/07/15 1046     Chief Complaint  Patient presents with  . Groin Pain   (Consider location/radiation/quality/duration/timing/severity/associated sxs/prior Treatment) HPI Comments: 50 yo male with a c/o 2 days of left groin pain. Worse with movement or certain positions. States was doing some heavy lifting recently. Denies any injury, fevers, chills, dysuria, vomiting, hematuria, diarrhea, constipation, melena, hematochezia.   Patient is a 50 y.o. male presenting with groin pain. The history is provided by the patient.  Groin Pain    Past Medical History  Diagnosis Date  . Coronary artery disease    Past Surgical History  Procedure Laterality Date  . Cardiac catheterization    . Bilateral carpal tunnel release    . Left hand surgery    . Colonoscopy with propofol N/A 02/28/2015    Procedure: COLONOSCOPY WITH PROPOFOL;  Surgeon: Josefine Class, MD;  Location: [facility] END[facility]COPY;  Service: Endoscopy;  Laterality: N/A;   History reviewed. No pertinent family history. Social History  Substance Use Topics  . Smoking status: Former Research scientist (life sciences)  . Smokeless tobacco: None  . Alcohol Use: No    Review of Systems  Allergies  Review of patient's allergies indicates no known allergies.  Home Medications   Prior to Admission medications   Medication Sig Start Date End Date Taking? Authorizing Provider  aspirin EC 81 MG tablet Take 81 mg by mouth daily.   Yes Historical Provider, MD  HYDROcodone-acetaminophen (NORCO/VICODIN) 5-325 MG tablet 1-2 tabs po q 8 hours prn 12/07/15   Norval Gable, MD  meloxicam (MOBIC) 15 MG tablet Take 15 mg by mouth daily.    Historical Provider, MD   Meds Ordered and Administered this Visit  Medications - No data to display  BP 130/94 mmHg  Pulse 67  Temp(Src) 97.8 F (36.6 C) (Oral)  Resp 18  Ht 6' (1.829 m)  Wt 205 lb (92.987 kg)  BMI 27.80  kg/m2  SpO2 99% No data found.   Physical Exam  Constitutional: He appears well-developed and well-nourished. No distress.  Abdominal: A hernia is present. Hernia confirmed positive in the left inguinal area (left inguinal; reducible).  Genitourinary: Testes normal.  Skin: He is not diaphoretic.  Nursing note and vitals reviewed.   ED Course  Procedures (including critical care time)  Labs Review Labs Reviewed - No data to display  Imaging Review No results found.   Visual Acuity Review  Right Eye Distance:   Left Eye Distance:   Bilateral Distance:    Right Eye Near:   Left Eye Near:    Bilateral Near:         MDM   1. Left inguinal hernia   (reducible)   New Prescriptions   HYDROCODONE-ACETAMINOPHEN (NORCO/VICODIN) 5-325 MG TABLET    1-2 tabs po q 8 hours prn   1. diagnosis reviewed with patient 2. rx as per orders above; reviewed possible side effects, interactions, risks and benefits  3. Recommend supportive treatment with rest, otc analgesics 4. Recommend follow up with general surgeon ASAP; given contact info for [facility];  if symptoms worsen then go to ED 4. Follow-up prn if symptoms worsen or don't improve    Norval Gable, MD 12/07/15 1112

## 2015-12-07 NOTE — Discharge Instructions (Signed)
As we discussed, we think her symptoms are most likely the result of a mild left inguinal hernia.  Please remember the recommendations we provided and read through the information below.  Follow-up with Dr. Adonis Huguenin or another general surgeon to discuss additional options.  Your testicular ultrasound also suggest the possibility of a mild infection.  We sent her urine to culture and are treating you with antibiotics to see if this helps.  However, since you have chronic blood in your urine and severe testicular pain, we recommend that you schedule an appointment with the urologist, Dr. Erlene Quan, for further evaluation.  Return to the emergency department with new or worsening symptoms that concern you.   Inguinal Hernia, Adult Muscles help keep everything in the body in its proper place. But if a weak spot in the muscles develops, something can poke through. That is called a hernia. When this happens in the lower part of the belly (abdomen), it is called an inguinal hernia. (It takes its name from a part of the body in this region called the inguinal canal.) A weak spot in the wall of muscles lets some fat or part of the small intestine bulge through. An inguinal hernia can develop at any age. Men get them more often than women. CAUSES  In adults, an inguinal hernia develops over time.  It can be triggered by:  Suddenly straining the muscles of the lower abdomen.  Lifting heavy objects.  Straining to have a bowel movement. Difficult bowel movements (constipation) can lead to this.  Constant coughing. This may be caused by smoking or lung disease.  Being overweight.  Being pregnant.  Working at a job that requires long periods of standing or heavy lifting.  Having had an inguinal hernia before. One type can be an emergency situation. It is called a strangulated inguinal hernia. It develops if part of the small intestine slips through the weak spot and cannot get back into the abdomen. The  blood supply can be cut off. If that happens, part of the intestine may die. This situation requires emergency surgery. SYMPTOMS  Often, a small inguinal hernia has no symptoms. It is found when a healthcare provider does a physical exam. Larger hernias usually have symptoms.   In adults, symptoms may include:  A lump in the groin. This is easier to see when the person is standing. It might disappear when lying down.  In men, a lump in the scrotum.  Pain or burning in the groin. This occurs especially when lifting, straining or coughing.  A dull ache or feeling of pressure in the groin.  Signs of a strangulated hernia can include:  A bulge in the groin that becomes very painful and tender to the touch.  A bulge that turns red or purple.  Fever, nausea and vomiting.  Inability to have a bowel movement or to pass gas. DIAGNOSIS  To decide if you have an inguinal hernia, a healthcare provider will probably do a physical examination.  This will include asking questions about any symptoms you have noticed.  The healthcare provider might feel the groin area and ask you to cough. If an inguinal hernia is felt, the healthcare provider may try to slide it back into the abdomen.  Usually no other tests are needed. TREATMENT  Treatments can vary. The size of the hernia makes a difference. Options include:  Watchful waiting. This is often suggested if the hernia is small and you have had no symptoms.  No medical  procedure will be done unless symptoms develop.  You will need to watch closely for symptoms. If any occur, contact your healthcare provider right away.  Surgery. This is used if the hernia is larger or you have symptoms.  Open surgery. This is usually an outpatient procedure (you will not stay overnight in a hospital). An cut (incision) is made through the skin in the groin. The hernia is put back inside the abdomen. The weak area in the muscles is then repaired by  herniorrhaphy or hernioplasty. Herniorrhaphy: in this type of surgery, the weak muscles are sewn back together. Hernioplasty: a patch or mesh is used to close the weak area in the abdominal wall.  Laparoscopy. In this procedure, a surgeon makes small incisions. A thin tube with a tiny video camera (called a laparoscope) is put into the abdomen. The surgeon repairs the hernia with mesh by looking with the video camera and using two long instruments. HOME CARE INSTRUCTIONS   After surgery to repair an inguinal hernia:  You will need to take pain medicine prescribed by your healthcare provider. Follow all directions carefully.  You will need to take care of the wound from the incision.  Your activity will be restricted for awhile. This will probably include no heavy lifting for several weeks. You also should not do anything too active for a few weeks. When you can return to work will depend on the type of job that you have.  During "watchful waiting" periods, you should:  Maintain a healthy weight.  Eat a diet high in fiber (fruits, vegetables and whole grains).  Drink plenty of fluids to avoid constipation. This means drinking enough water and other liquids to keep your urine clear or pale yellow.  Do not lift heavy objects.  Do not stand for long periods of time.  Quit smoking. This should keep you from developing a frequent cough. SEEK MEDICAL CARE IF:   A bulge develops in your groin area.  You feel pain, a burning sensation or pressure in the groin. This might be worse if you are lifting or straining.  You develop a fever of more than 100.5 F (38.1 C). SEEK IMMEDIATE MEDICAL CARE IF:   Pain in the groin increases suddenly.  A bulge in the groin gets bigger suddenly and does not go down.  For men, there is sudden pain in the scrotum. Or, the size of the scrotum increases.  A bulge in the groin area becomes red or purple and is painful to touch.  You have nausea or  vomiting that does not go away.  You feel your heart beating much faster than normal.  You cannot have a bowel movement or pass gas.  You develop a fever of more than 102.0 F (38.9 C).   This information is not intended to replace advice given to you by your health care provider. Make sure you discuss any questions you have with your health care provider.   Document Released: 12/23/2008 Document Revised: 10/29/2011 Document Reviewed: 02/07/2015 Elsevier Interactive Patient Education 2016 [place].  Cryotherapy Cryotherapy means treatment with cold. Ice or gel packs can be used to reduce both pain and swelling. Ice is the most helpful within the first 24 to 48 hours after an injury or flare-up from overusing a muscle or joint. Sprains, strains, spasms, burning pain, shooting pain, and aches can all be eased with ice. Ice can also be used when recovering from surgery. Ice is effective, has very few side effects,  and is safe for most people to use. PRECAUTIONS  Ice is not a safe treatment option for people with:  Raynaud phenomenon. This is a condition affecting small blood vessels in the extremities. Exposure to cold may cause your problems to return.  Cold hypersensitivity. There are many forms of cold hypersensitivity, including:  Cold urticaria. Red, itchy hives appear on the skin when the tissues begin to warm after being iced.  Cold erythema. This is a red, itchy rash caused by exposure to cold.  Cold hemoglobinuria. Red blood cells break down when the tissues begin to warm after being iced. The hemoglobin that carry oxygen are passed into the urine because they cannot combine with blood proteins fast enough.  Numbness or altered sensitivity in the area being iced. If you have any of the following conditions, do not use ice until you have discussed cryotherapy with your caregiver:  Heart conditions, such as arrhythmia, angina, or chronic heart disease.  High blood  pressure.  Healing wounds or open skin in the area being iced.  Current infections.  Rheumatoid arthritis.  Poor circulation.  Diabetes. Ice slows the blood flow in the region it is applied. This is beneficial when trying to stop inflamed tissues from spreading irritating chemicals to surrounding tissues. However, if you expose your skin to cold temperatures for too long or without the proper protection, you can damage your skin or nerves. Watch for signs of skin damage due to cold. HOME CARE INSTRUCTIONS Follow these tips to use ice and cold packs safely.  Place a dry or damp towel between the ice and skin. A damp towel will cool the skin more quickly, so you may need to shorten the time that the ice is used.  For a more rapid response, add gentle compression to the ice.  Ice for no more than 10 to 20 minutes at a time. The bonier the area you are icing, the less time it will take to get the benefits of ice.  Check your skin after 5 minutes to make sure there are no signs of a poor response to cold or skin damage.  Rest 20 minutes or more between uses.  Once your skin is numb, you can end your treatment. You can test numbness by very lightly touching your skin. The touch should be so light that you do not see the skin dimple from the pressure of your fingertip. When using ice, most people will feel these normal sensations in this order: cold, burning, aching, and numbness.  Do not use ice on someone who cannot communicate their responses to pain, such as small children or people with dementia. HOW TO MAKE AN ICE PACK Ice packs are the most common way to use ice therapy. Other methods include ice massage, ice baths, and cryosprays. Muscle creams that cause a cold, tingly feeling do not offer the same benefits that ice offers and should not be used as a substitute unless recommended by your caregiver. To make an ice pack, do one of the following:  Place crushed ice or a bag of frozen  vegetables in a sealable plastic bag. Squeeze out the excess air. Place this bag inside another plastic bag. Slide the bag into a pillowcase or place a damp towel between your skin and the bag.  Mix 3 parts water with 1 part rubbing alcohol. Freeze the mixture in a sealable plastic bag. When you remove the mixture from the freezer, it will be slushy. Squeeze out the excess air.  Place this bag inside another plastic bag. Slide the bag into a pillowcase or place a damp towel between your skin and the bag. SEEK MEDICAL CARE IF:  You develop white spots on your skin. This may give the skin a blotchy (mottled) appearance.  Your skin turns blue or pale.  Your skin becomes waxy or hard.  Your swelling gets worse. MAKE SURE YOU:   Understand these instructions.  Will watch your condition.  Will get help right away if you are not doing well or get worse.   This information is not intended to replace advice given to you by your health care provider. Make sure you discuss any questions you have with your health care provider.   Document Released: 04/02/2011 Document Revised: 08/27/2014 Document Reviewed: 04/02/2011 Elsevier Interactive Patient Education [employer].

## 2015-12-07 NOTE — Discharge Instructions (Signed)

## 2015-12-07 NOTE — ED Provider Notes (Signed)
[facility] Emergency Department Provider Note  ____________________________________________  Time seen: Approximately 4:29 PM  I have reviewed the triage vital signs and the nursing notes.   HISTORY  Chief Complaint Testicle Pain    HPI Matthew [name] is a 50 y.o. male with a history of chronic hematuria according to both him and his wife who presents with acute onset of left groin and testicular pain starting suddenly 2 days ago.  He was doing some heavy lifting of his lawn tractor when he felt acute sharp severe pain in his left groin and testicle.  It also radiates to his left hip.  He has had some nausea but no vomiting.  He denies fever/chills, chest pain, shortness of breath, vomiting, abdominal pain.He also denies dysuria and does not have any swelling in his scrotum or testicles of which she is aware.  He went to the urgent care today and was diagnosed with a left inguinal easily reducible hernia and then went to his primary care doctor for second opinion.  She recommended that he come to the emergency department for an ultrasound to rule out testicular torsion.  Past Medical History  Diagnosis Date  . Coronary artery disease     There are no active problems to display for this patient.   Past Surgical History  Procedure Laterality Date  . Cardiac catheterization    . Bilateral carpal tunnel release    . Left hand surgery    . Colonoscopy with propofol N/A 02/28/2015    Procedure: COLONOSCOPY WITH PROPOFOL;  Surgeon: Josefine Class, MD;  Location: [facility] ENDOSCOPY;  Service: Endoscopy;  Laterality: N/A;    Current Outpatient Rx  Name  Route  Sig  Dispense  Refill  . aspirin EC 81 MG tablet   Oral   Take 81 mg by mouth daily.         Marland Kitchen docusate sodium (COLACE) 100 MG capsule      Take 1 tablet once or twice daily as needed for constipation while taking narcotic pain medicine   30 capsule   0   . HYDROcodone-acetaminophen  (NORCO/VICODIN) 5-325 MG tablet      1-2 tabs po q 8 hours prn   6 tablet   0   . levofloxacin (LEVAQUIN) 500 MG tablet   Oral   Take 1 tablet (500 mg total) by mouth daily.   10 tablet   0   . meloxicam (MOBIC) 15 MG tablet   Oral   Take 15 mg by mouth daily.           Allergies Review of patient's allergies indicates no known allergies.  No family history on file.  Social History Social History  Substance Use Topics  . Smoking status: Former Research scientist (life sciences)  . Smokeless tobacco: None  . Alcohol Use: No    Review of Systems Constitutional: No fever/chills Eyes: No visual changes. ENT: No sore throat. Cardiovascular: Denies chest pain. Respiratory: Denies shortness of breath. Gastrointestinal: No abdominal pain.  No nausea, no vomiting.  No diarrhea.  No constipation. Genitourinary: Negative for dysuria.  Pain in the left testicle/scrotum that radiates into his left groin and left hip. Musculoskeletal: Negative for back pain. Skin: Negative for rash. Neurological: Negative for headaches, focal weakness or numbness.  10-point ROS otherwise negative.  ____________________________________________   PHYSICAL EXAM:  VITAL SIGNS: ED Triage Vitals  Enc Vitals Group     BP 12/07/15 1521 139/92 mmHg     Pulse Rate 12/07/15 1521  60     Resp 12/07/15 1521 18     Temp 12/07/15 1521 98 F (36.7 C)     Temp Source 12/07/15 1521 Oral     SpO2 12/07/15 1521 100 %     Weight 12/07/15 1521 205 lb (92.987 kg)     Height --      Head Cir --      Peak Flow --      Pain Score 12/07/15 1522 8     Pain Loc --      Pain Edu? --      Excl. in [place]? --     Constitutional: Alert and oriented. Well appearing and in no acute distress but does appear uncomfortable. Eyes: Conjunctivae are normal. PERRL. EOMI. Head: Atraumatic. Nose: No congestion/rhinnorhea. Mouth/Throat: Mucous membranes are moist.  Oropharynx non-erythematous. Neck: No stridor.  No meningeal signs.    Cardiovascular: Normal rate, regular rhythm. Good peripheral circulation. Grossly normal heart sounds.   Respiratory: Normal respiratory effort.  No retractions. Lungs CTAB. Gastrointestinal: Soft and nontender. No distention.  Genitourinary: Normal external male genitalia with no discharge from the urethra.  Penis is nontender.  Right testis is nontender.  Left testis is tender without the ability to localize the tenderness specifically to the epididymis.  There is no significant swelling or mass present in the scrotum.  Elevation of the scrotum and pressure during the hernia exam leaves the pain.  I do not specifically palpate a hernia and he does not have any bulging masses consistent with a hernia in the left groin but again clinically he feels better with pressure on that area and better when placed in Trendelenburg.  There is no firm, tender to palpation mass that would be consistent with a strangulated or incarcerated hernia. Musculoskeletal: No lower extremity tenderness nor edema. No gross deformities of extremities. Neurologic:  Normal speech and language. No gross focal neurologic deficits are appreciated.  Skin:  Skin is warm, dry and intact. No rash noted. Psychiatric: Mood and affect are normal. Speech and behavior are normal.  ____________________________________________   LABS (all labs ordered are listed, but only abnormal results are displayed)  Labs Reviewed  URINE CULTURE  CHLAMYDIA/NGC RT PCR ([facility] ONLY)  URINALYSIS COMPLETEWITH MICROSCOPIC ([facility] ONLY)   ____________________________________________  EKG  None ____________________________________________  RADIOLOGY   US Scrotum  12/07/2015  CLINICAL DATA:  Left testicular pain for 2 days, worsening today EXAM: SCROTAL ULTRASOUND DOPPLER ULTRASOUND OF THE TESTICLES TECHNIQUE: Complete ultrasound examination of the testicles, epididymis, and other scrotal structures was performed. Color and spectral  Doppler ultrasound were also utilized to evaluate blood flow to the testicles. COMPARISON:  None. FINDINGS: Right testicle Measurements: 4.9 by 2.3 by 3.2 cm. No mass or microlithiasis visualized. Left testicle Measurements: 4.9 by 2.0 by 3.1 cm. No mass or microlithiasis visualized. Questionable accentuated color Doppler flow on the left testicle compared to the right, not seen on the image containing both testicles it once, but suggested for example on image 23. Right epididymis:  0.8 cm epididymal cyst or spermatocele. Left epididymis:  0.5 cm epididymal cyst or spermatocele. Hydrocele:  Bilateral small hydroceles, subtle complexity. Varicocele:  None visualized. Pulsed Doppler interrogation of both testes demonstrates normal low resistance arterial and venous waveforms bilaterally. IMPRESSION: 1. Mild hyperemia of the left testicle, without other sonographic abnormality of the testicles -this could reflect mild orchitis. 2. Bilateral epididymal cysts or spermatoceles. 3. Bilateral small hydroceles with subtle complexity. 4. No evidence of testicular torsion. Electronically Signed  By: Van Clines M.D.   On: 12/07/2015 16:19   [place] Art/ven Flow Abd Pelv Doppler  12/07/2015  CLINICAL DATA:  Left testicular pain for 2 days, worsening today EXAM: SCROTAL ULTRASOUND DOPPLER ULTRASOUND OF THE TESTICLES TECHNIQUE: Complete ultrasound examination of the testicles, epididymis, and other scrotal structures was performed. Color and spectral Doppler ultrasound were also utilized to evaluate blood flow to the testicles. COMPARISON:  None. FINDINGS: Right testicle Measurements: 4.9 by 2.3 by 3.2 cm. No mass or microlithiasis visualized. Left testicle Measurements: 4.9 by 2.0 by 3.1 cm. No mass or microlithiasis visualized. Questionable accentuated color Doppler flow on the left testicle compared to the right, not seen on the image containing both testicles it once, but suggested for example on image 23. Right  epididymis:  0.8 cm epididymal cyst or spermatocele. Left epididymis:  0.5 cm epididymal cyst or spermatocele. Hydrocele:  Bilateral small hydroceles, subtle complexity. Varicocele:  None visualized. Pulsed Doppler interrogation of both testes demonstrates normal low resistance arterial and venous waveforms bilaterally. IMPRESSION: 1. Mild hyperemia of the left testicle, without other sonographic abnormality of the testicles -this could reflect mild orchitis. 2. Bilateral epididymal cysts or spermatoceles. 3. Bilateral small hydroceles with subtle complexity. 4. No evidence of testicular torsion. Electronically Signed   By: Van Clines M.D.   On: 12/07/2015 16:19    ____________________________________________   PROCEDURES  Procedure(s) performed: None  Critical Care performed: No ____________________________________________   INITIAL IMPRESSION / ASSESSMENT AND PLAN / ED COURSE  Pertinent labs & imaging results that were available during my care of the patient were reviewed by me and considered in my medical decision making (see chart for details).  Possible orchitis on ultrasound with no evidence of torsion.  Hernia is clinically more likely than orchitis/epididymitis, but I will treat him empirically.  He states that he is monogamous with his wife, who is also present in the exam room, and I have a low suspicion for STD.  I will treat him based on his age group with Levaquin 500 mg daily 10 days.  I have also sent a urinalysis, urine culture, and GC/Chlamydia test.  Given his left testicular pain and a history of chronic hematuria (according to the patient) which has never been evaluated, I gave him follow-up information both with Dr. Adonis Huguenin with general surgery as well as with Dr. Erlene Quan with urology.  If my usual and customary return precautions.  He already has a prescription for pain medicine from the urgent care and I recommended the use of ibuprofen 600 mg 3 times a day with  meals.  ____________________________________________  FINAL CLINICAL IMPRESSION(S) / ED DIAGNOSES  Final diagnoses:  Left inguinal hernia  Left testicular pain  Hematuria      NEW MEDICATIONS STARTED DURING THIS VISIT:  New Prescriptions   DOCUSATE SODIUM (COLACE) 100 MG CAPSULE    Take 1 tablet once or twice daily as needed for constipation while taking narcotic pain medicine   LEVOFLOXACIN (LEVAQUIN) 500 MG TABLET    Take 1 tablet (500 mg total) by mouth daily.      Note:  This document was prepared using Dragon voice recognition software and may include unintentional dictation errors.   Hinda Kehr, MD 12/07/15 [phone]

## 2015-12-09 LAB — URINE CULTURE
CULTURE: NO GROWTH
Special Requests: NORMAL

## 2015-12-14 ENCOUNTER — Ambulatory Visit (INDEPENDENT_AMBULATORY_CARE_PROVIDER_SITE_OTHER): Payer: 59 | Admitting: Surgery

## 2015-12-14 ENCOUNTER — Encounter: Payer: Self-pay | Admitting: Surgery

## 2015-12-14 VITALS — BP 145/102 | HR 72 | Temp 97.7°F | Ht 71.5 in | Wt 217.5 lb

## 2015-12-14 DIAGNOSIS — K4020 Bilateral inguinal hernia, without obstruction or gangrene, not specified as recurrent: Secondary | ICD-10-CM | POA: Diagnosis not present

## 2015-12-14 NOTE — Patient Instructions (Signed)
Patient was given his blue sheet.

## 2015-12-15 ENCOUNTER — Telehealth: Payer: Self-pay | Admitting: Surgery

## 2015-12-15 NOTE — Telephone Encounter (Signed)
Pt advised of pre op date/time and sx date. Sx: 12/29/15 with Dr Pabon--Bilateral laparoscopic inguinal hernia repair.  Pre op: 12/22/15 @ 10:30--office.   Patient made aware to call [phone], between 1-3:00pm the day before surgery, to find out what time to arrive.

## 2015-12-16 NOTE — Progress Notes (Signed)
Patient ID: Matthew [name], male   DOB: 1965-11-03, 50 y.o.   MRN: [id number]  History of Present Illness Matthew [name] is a 50 y.o. male referred by Dr. Cornelius Moras for symptomatic left inguinal hernia. He reports that over the last few days after lifting and [name] he experienced some left inguinal pain radiated to the left scrotum left inner thigh and left testicle. The pain is intermittent is sharp and is moderate to severe. Apparently the pain worsens when is straining. No evidence of fevers and chills or vomiting. He recently went to his primary care and also to the emergency room to rule out testicular torsion ultrasound showed no evidence of torsion he was treated for possible epididymitis. Apparently he had had a cardiac catheter which were clean. No evidence of stents in the past. He is able to perform more than 4 Mets of activity without any shortness of breath or chest pain No Previous abdominal operations   Past Medical History Past Medical History  Diagnosis Date  . Coronary artery disease        Past Surgical History  Procedure Laterality Date  . Cardiac catheterization    . Bilateral carpal tunnel release    . Left hand surgery    . Colonoscopy with propofol N/A 02/28/2015    Procedure: COLONOSCOPY WITH PROPOFOL;  Surgeon: Josefine Class, MD;  Location: [facility] ENDOSCOPY;  Service: Endoscopy;  Laterality: N/A;    No Known Allergies  Current Outpatient Prescriptions  Medication Sig Dispense Refill  . aspirin EC 81 MG tablet Take 81 mg by mouth daily.    Marland Kitchen docusate sodium (COLACE) 100 MG capsule Take 1 tablet once or twice daily as needed for constipation while taking narcotic pain medicine 30 capsule 0  . HYDROcodone-acetaminophen (NORCO/VICODIN) 5-325 MG tablet 1-2 tabs po q 8 hours prn 6 tablet 0  . levofloxacin (LEVAQUIN) 500 MG tablet Take 1 tablet (500 mg total) by mouth daily. 10 tablet 0  . meloxicam (MOBIC) 15 MG tablet Take 15 mg by mouth daily.     No  current facility-administered medications for this visit.    Family History History reviewed. No pertinent family history.    Social History Social History  Substance Use Topics  . Smoking status: Former Smoker    Quit date: 12/14/1998  . Smokeless tobacco: None  . Alcohol Use: No     ROS 10 point ROS was performed and is otherwise negative  Physical Exam Blood pressure 145/102, pulse 72, temperature 97.7 F (36.5 C), temperature source Oral, height 5' 11.5" (1.816 m), weight 98.657 kg (217 lb 8 oz).  CONSTITUTIONAL: NAD well developed EYES: Pupils equal, round, and reactive to light, Sclera non-icteric. EARS, NOSE, MOUTH AND THROAT: The oropharynx is clear. Oral mucosa is pink and moist. Hearing is intact to voice.  NECK: Trachea is midline, and there is no jugular venous distension. Thyroid is without palpable abnormalities. LYMPH NODES:  Lymph nodes in the neck are not enlarged. RESPIRATORY:  Lungs are clear, and breath sounds are equal bilaterally. Normal respiratory effort without pathologic use of accessory muscles. CARDIOVASCULAR: Heart is regular without murmurs, gallops, or rubs. GI: The abdomen is  soft, and nondistended. There were no palpable masses. There was no hepatosplenomegaly. There were normal bowel sounds. Bilateral inguinal hernias, left tender to palpation, reducible. GU: Left testicle TTP, no torsion MUSCULOSKELETAL:  Normal muscle strength and tone in all four extremities.    SKIN: Skin turgor is normal. There are no pathologic skin  lesions.  NEUROLOGIC:  Motor and sensation is grossly normal.  Cranial nerves are grossly intact. PSYCH:  Alert and oriented to person, place and time. Affect is normal.  Data Reviewed  Assessment/Plan Symptomatic bilateral inguinal hernias in need for repair. His current situation more likely his ilioinguinal and iliohypogastric nerves are irritated secondary to the hernia. Discussed with the patient in detail that most  relevant risk factors for developing chronic pain after inguinal hernia repair is existent off previous pain. I do recommend upper scopic bilateral inguinal hernia repair with mesh given his symptoms. I'll like to wait until his antibiotics are finished. I have explained the procedure, risks, and aftercare of inguinal hernia repair to [name].   Risks include but are not limited to bleeding, infection, wound problems, anesthesia, recurrence, bladder or intestine injury, urinary retention, testicular dysfunction, chronic pain, mesh problems.  He  seems to understand and agrees to proceed.  Questions were answered to his stated satisfaction. Plan for lap BIH possible open.   Caroleen Hamman, MD [place] 12/16/2015, 7:32 PM

## 2015-12-16 NOTE — Telephone Encounter (Signed)
Patient has called back to be informed of all surgery information.

## 2015-12-22 ENCOUNTER — Encounter
Admission: RE | Admit: 2015-12-22 | Discharge: 2015-12-22 | Disposition: A | Discharge status: Home / Self Care | Payer: Managed Care, Other (non HMO) | Source: Ambulatory Visit | Attending: Surgery | Admitting: Surgery

## 2015-12-22 DIAGNOSIS — Z01812 Encounter for preprocedural laboratory examination: Secondary | ICD-10-CM | POA: Diagnosis present

## 2015-12-22 DIAGNOSIS — I2510 Atherosclerotic heart disease of native coronary artery without angina pectoris: Secondary | ICD-10-CM | POA: Insufficient documentation

## 2015-12-22 DIAGNOSIS — Z01810 Encounter for preprocedural cardiovascular examination: Secondary | ICD-10-CM | POA: Insufficient documentation

## 2015-12-22 DIAGNOSIS — R001 Bradycardia, unspecified: Secondary | ICD-10-CM | POA: Diagnosis not present

## 2015-12-22 HISTORY — DX: Gastro-esophageal reflux disease without esophagitis: K21.9

## 2015-12-22 LAB — BASIC METABOLIC PANEL
ANION GAP: 7 (ref 5–15)
BUN: 16 mg/dL (ref 6–20)
CHLORIDE: 104 mmol/L (ref 101–111)
CO2: 25 mmol/L (ref 22–32)
Calcium: 8.9 mg/dL (ref 8.9–10.3)
Creatinine, Ser: 0.85 mg/dL (ref 0.61–1.24)
GFR calc Af Amer: >60 mL/min (ref >60)
GFR calc non Af Amer: >60 mL/min (ref >60)
Glucose, Bld: 115 mg/dL — ABNORMAL HIGH (ref 65–99)
POTASSIUM: 3.8 mmol/L (ref 3.5–5.1)
SODIUM: 136 mmol/L (ref 135–145)

## 2015-12-22 LAB — CBC
HCT: 41.6 % (ref 40.0–52.0)
HEMOGLOBIN: 13.7 g/dL (ref 13.0–18.0)
MCH: 26.1 pg (ref 26.0–34.0)
MCHC: 32.9 g/dL (ref 32.0–36.0)
MCV: 79.4 fL — AB (ref 80.0–100.0)
Platelets: 205 10*3/uL (ref 150–440)
RBC: 5.23 MIL/uL (ref 4.40–5.90)
RDW: 13.3 % (ref 11.5–14.5)
WBC: 7.1 10*3/uL (ref 3.8–10.6)

## 2015-12-22 NOTE — Patient Instructions (Signed)
Your procedure is scheduled on: THU 12/29/15 Report to Day Surgery. 2ND FLOOR MEDICAL MALL ENTRANCE To find out your arrival time please call [phone] between 1PM - 3PM on WED 12/28/15.  Remember: Instructions that are not followed completely may result in serious medical risk, up to and including death, or upon the discretion of your surgeon and anesthesiologist your surgery may need to be rescheduled.    __X__ 1. Do not eat food or drink liquids after midnight. No gum chewing or hard candies.     __X__ 2. No Alcohol for 24 hours before or after surgery.   ____ 3. Bring all medications with you on the day of surgery if instructed.    __X__ 4. Notify your doctor if there is any change in your medical condition     (cold, fever, infections).     Do not wear jewelry, make-up, hairpins, clips or nail polish.  Do not wear lotions, powders, or perfumes.   Do not shave 48 hours prior to surgery. Men may shave face and neck.  Do not bring valuables to the hospital.    [facility] is not responsible for any belongings or valuables.               Contacts, dentures or bridgework may not be worn into surgery.  Leave your suitcase in the car. After surgery it may be brought to your room.  For patients admitted to the hospital, discharge time is determined by your                treatment team.   Patients discharged the day of surgery will not be allowed to drive home.   Please read over the following fact sheets that you were given:   Surgical Site Infection Prevention   __X__ Take these medicines the morning of surgery with A SIP OF WATER:    1. PREVACID  2.   3.   4.  5.  6.  ____ Fleet Enema (as directed)   __X__ Use CHG Soap as directed  ____ Use inhalers on the day of surgery  ____ Stop metformin 2 days prior to surgery    ____ Take 1/2 of usual insulin dose the night before surgery and none on the morning of surgery.   __X__ Stop Coumadin/Plavix/aspirin on  TODAY  __X__ Stop Anti-inflammatories on TODAY (IBUPROFEN) MAY USE TYLENOL DURING WORKING HOURS IF NEEDED   ____ Stop supplements until after surgery.    ____ Bring C-Pap to the hospital.

## 2015-12-29 ENCOUNTER — Encounter
Admission: RE | Disposition: A | Discharge status: Home / Self Care | Payer: Self-pay | Source: Ambulatory Visit | Attending: Surgery

## 2015-12-29 ENCOUNTER — Ambulatory Visit
Admission: RE | Admit: 2015-12-29 | Discharge: 2015-12-29 | Disposition: A | Discharge status: Home / Self Care | Payer: Managed Care, Other (non HMO) | Source: Ambulatory Visit | Attending: Surgery | Admitting: Surgery

## 2015-12-29 ENCOUNTER — Ambulatory Visit: Payer: Managed Care, Other (non HMO) | Admitting: Certified Registered Nurse Anesthetist

## 2015-12-29 ENCOUNTER — Encounter: Payer: Self-pay | Admitting: *Deleted

## 2015-12-29 DIAGNOSIS — K4021 Bilateral inguinal hernia, without obstruction or gangrene, recurrent: Secondary | ICD-10-CM | POA: Insufficient documentation

## 2015-12-29 DIAGNOSIS — Z79899 Other long term (current) drug therapy: Secondary | ICD-10-CM | POA: Insufficient documentation

## 2015-12-29 DIAGNOSIS — K219 Gastro-esophageal reflux disease without esophagitis: Secondary | ICD-10-CM | POA: Diagnosis not present

## 2015-12-29 DIAGNOSIS — Z87891 Personal history of nicotine dependence: Secondary | ICD-10-CM | POA: Insufficient documentation

## 2015-12-29 DIAGNOSIS — K4020 Bilateral inguinal hernia, without obstruction or gangrene, not specified as recurrent: Secondary | ICD-10-CM | POA: Insufficient documentation

## 2015-12-29 DIAGNOSIS — D176 Benign lipomatous neoplasm of spermatic cord: Secondary | ICD-10-CM | POA: Diagnosis not present

## 2015-12-29 DIAGNOSIS — Z7982 Long term (current) use of aspirin: Secondary | ICD-10-CM | POA: Diagnosis not present

## 2015-12-29 DIAGNOSIS — I25119 Atherosclerotic heart disease of native coronary artery with unspecified angina pectoris: Secondary | ICD-10-CM | POA: Diagnosis not present

## 2015-12-29 HISTORY — PX: INGUINAL HERNIA REPAIR: SHX194

## 2015-12-29 LAB — GLUCOSE, CAPILLARY: Glucose-Capillary: 94 mg/dL (ref 65–99)

## 2015-12-29 SURGERY — REPAIR, HERNIA, INGUINAL, LAPAROSCOPIC
Anesthesia: General | Laterality: Bilateral | Wound class: Clean

## 2015-12-29 MED ORDER — ONDANSETRON HCL 4 MG/2ML IJ SOLN
4.0000 mg | Freq: Once | INTRAMUSCULAR | Status: AC | PRN
  Administered 2015-12-29: 4 mg via INTRAVENOUS

## 2015-12-29 MED ORDER — LIDOCAINE HCL (CARDIAC) 20 MG/ML IV SOLN
INTRAVENOUS | Status: DC | PRN
  Administered 2015-12-29: 40 mg via INTRAVENOUS

## 2015-12-29 MED ORDER — OXYCODONE-ACETAMINOPHEN 7.5-325 MG PO TABS: 2.0000 | ORAL_TABLET | ORAL | Status: DC | PRN

## 2015-12-29 MED ORDER — CHLORHEXIDINE GLUCONATE 4 % EX LIQD: 1.0000 "application " | Freq: Once | CUTANEOUS | Status: DC

## 2015-12-29 MED ORDER — HYDROMORPHONE HCL 1 MG/ML IJ SOLN
0.2500 mg | INTRAMUSCULAR | Status: DC | PRN
  Administered 2015-12-29 (×4): 0.5 mg via INTRAVENOUS

## 2015-12-29 MED ORDER — HYDROMORPHONE HCL 1 MG/ML IJ SOLN
INTRAMUSCULAR | Status: AC
  Filled 2015-12-29: qty 1

## 2015-12-29 MED ORDER — ACETAMINOPHEN 10 MG/ML IV SOLN
INTRAVENOUS | Status: DC | PRN
  Administered 2015-12-29: 1000 mg via INTRAVENOUS

## 2015-12-29 MED ORDER — EPHEDRINE SULFATE 50 MG/ML IJ SOLN
INTRAMUSCULAR | Status: DC | PRN
  Administered 2015-12-29: 5 mg via INTRAVENOUS

## 2015-12-29 MED ORDER — ONDANSETRON HCL 4 MG/2ML IJ SOLN
INTRAMUSCULAR | Status: AC
  Filled 2015-12-29: qty 2

## 2015-12-29 MED ORDER — ONDANSETRON HCL 4 MG/2ML IJ SOLN
INTRAMUSCULAR | Status: DC | PRN
  Administered 2015-12-29: 4 mg via INTRAVENOUS

## 2015-12-29 MED ORDER — GLYCOPYRROLATE 0.2 MG/ML IJ SOLN
INTRAMUSCULAR | Status: DC | PRN
  Administered 2015-12-29: 0.1 mg via INTRAVENOUS
  Administered 2015-12-29: 0.6 mg via INTRAVENOUS

## 2015-12-29 MED ORDER — DEXAMETHASONE SODIUM PHOSPHATE 10 MG/ML IJ SOLN
INTRAMUSCULAR | Status: DC | PRN
  Administered 2015-12-29: 5 mg via INTRAVENOUS

## 2015-12-29 MED ORDER — CEFAZOLIN SODIUM-DEXTROSE 2-4 GM/100ML-% IV SOLN
INTRAVENOUS | Status: AC
  Filled 2015-12-29: qty 100

## 2015-12-29 MED ORDER — BUPIVACAINE-EPINEPHRINE (PF) 0.25% -1:200000 IJ SOLN
INTRAMUSCULAR | Status: AC
  Filled 2015-12-29: qty 30

## 2015-12-29 MED ORDER — FENTANYL CITRATE (PF) 100 MCG/2ML IJ SOLN
INTRAMUSCULAR | Status: DC | PRN
  Administered 2015-12-29 (×3): 50 ug via INTRAVENOUS
  Administered 2015-12-29: 100 ug via INTRAVENOUS

## 2015-12-29 MED ORDER — FENTANYL CITRATE (PF) 100 MCG/2ML IJ SOLN
25.0000 ug | INTRAMUSCULAR | Status: DC | PRN
  Administered 2015-12-29 (×4): 25 ug via INTRAVENOUS

## 2015-12-29 MED ORDER — ROCURONIUM BROMIDE 100 MG/10ML IV SOLN
INTRAVENOUS | Status: DC | PRN
  Administered 2015-12-29: 40 mg via INTRAVENOUS
  Administered 2015-12-29 (×4): 10 mg via INTRAVENOUS
  Administered 2015-12-29: 5 mg via INTRAVENOUS

## 2015-12-29 MED ORDER — PROPOFOL 10 MG/ML IV BOLUS
INTRAVENOUS | Status: DC | PRN
  Administered 2015-12-29: 200 mg via INTRAVENOUS

## 2015-12-29 MED ORDER — SODIUM CHLORIDE 0.9 % IV SOLN
INTRAVENOUS | Status: DC
  Administered 2015-12-29: 11:00:00 via INTRAVENOUS

## 2015-12-29 MED ORDER — CEFAZOLIN SODIUM-DEXTROSE 2-4 GM/100ML-% IV SOLN
2.0000 g | INTRAVENOUS | Status: AC
  Administered 2015-12-29: 2 g via INTRAVENOUS

## 2015-12-29 MED ORDER — FENTANYL CITRATE (PF) 100 MCG/2ML IJ SOLN
INTRAMUSCULAR | Status: AC
  Filled 2015-12-29: qty 2

## 2015-12-29 MED ORDER — ACETAMINOPHEN 10 MG/ML IV SOLN
INTRAVENOUS | Status: AC
  Filled 2015-12-29: qty 100

## 2015-12-29 MED ORDER — KETOROLAC TROMETHAMINE 30 MG/ML IJ SOLN
INTRAMUSCULAR | Status: DC | PRN
  Administered 2015-12-29: 30 mg via INTRAVENOUS

## 2015-12-29 MED ORDER — BUPIVACAINE-EPINEPHRINE (PF) 0.25% -1:200000 IJ SOLN
INTRAMUSCULAR | Status: DC | PRN
  Administered 2015-12-29: 30 mL via PERINEURAL

## 2015-12-29 MED ORDER — NEOSTIGMINE METHYLSULFATE 10 MG/10ML IV SOLN
INTRAVENOUS | Status: DC | PRN
  Administered 2015-12-29: 4 mg via INTRAVENOUS

## 2015-12-29 SURGICAL SUPPLY — 35 items
BLADE CLIPPER SURG (BLADE) ×2 IMPLANT
CANISTER SUCT 1200ML W/VALVE (MISCELLANEOUS) ×2 IMPLANT
CHLORAPREP W/TINT 26ML (MISCELLANEOUS) ×2 IMPLANT
DEVICE SECURE STRAP 25 ABSORB (INSTRUMENTS) ×2 IMPLANT
DISSECT BALLN SPACEMKR OVL PDB (BALLOONS) ×2
DISSECTOR BALLN SPCMKR OVL PDB (BALLOONS) ×1 IMPLANT
DISSECTOR KITTNER STICK (MISCELLANEOUS) ×2 IMPLANT
DISSECTORS/KITTNER STICK (MISCELLANEOUS) ×4
DRAPE INCISE IOBAN 66X45 STRL (DRAPES) ×2 IMPLANT
GLOVE BIO SURGEON STRL SZ7 (GLOVE) ×2 IMPLANT
GOWN STRL REUS W/ TWL LRG LVL3 (GOWN DISPOSABLE) ×2 IMPLANT
GOWN STRL REUS W/TWL LRG LVL3 (GOWN DISPOSABLE) ×2
IRRIGATION STRYKERFLOW (MISCELLANEOUS) ×1 IMPLANT
IRRIGATOR STRYKERFLOW (MISCELLANEOUS) ×2
IV NS 1000ML (IV SOLUTION) ×1
IV NS 1000ML BAXH (IV SOLUTION) ×1 IMPLANT
L-HOOK LAP DISP 36CM (ELECTROSURGICAL) ×2
LHOOK LAP DISP 36CM (ELECTROSURGICAL) ×1 IMPLANT
LIQUID BAND (GAUZE/BANDAGES/DRESSINGS) ×2 IMPLANT
MESH 3DMAX 3X5 LT MED (Mesh General) ×2 IMPLANT
MESH 3DMAX 3X5 RT MED (Mesh General) ×2 IMPLANT
NDL INSUFF ACCESS 14 VERSASTEP (NEEDLE) ×2 IMPLANT
NEEDLE HYPO 22GX1.5 SAFETY (NEEDLE) ×2 IMPLANT
NS IRRIG 500ML POUR BTL (IV SOLUTION) ×2 IMPLANT
PACK LAP CHOLECYSTECTOMY (MISCELLANEOUS) ×2 IMPLANT
PAD GROUND ADULT SPLIT (MISCELLANEOUS) ×2 IMPLANT
PENCIL ELECTRO HAND CTR (MISCELLANEOUS) ×4 IMPLANT
SCISSORS METZENBAUM CVD 33 (INSTRUMENTS) ×2 IMPLANT
SLEEVE ENDOPATH XCEL 5M (ENDOMECHANICALS) ×2 IMPLANT
SUT MNCRL AB 4-0 PS2 18 (SUTURE) ×2 IMPLANT
SUT VICRYL 0 AB UR-6 (SUTURE) ×2 IMPLANT
TACKER 5MM HERNIA 3.5CML NAB (ENDOMECHANICALS) IMPLANT
TROCAR BALLN 10M OMST10SB SPAC (TROCAR) ×2 IMPLANT
TROCAR XCEL NON-BLD 5MMX100MML (ENDOMECHANICALS) ×4 IMPLANT
TUBING INSUFFLATOR HI FLOW (MISCELLANEOUS) ×2 IMPLANT

## 2015-12-29 NOTE — H&P (View-Only) (Signed)
Patient ID: Matthew [name], male   DOB: 1965-11-03, 50 y.o.   MRN: [id number]  History of Present Illness Matthew [name] is a 50 y.o. male referred by Dr. Cornelius Moras for symptomatic left inguinal hernia. He reports that over the last few days after lifting and Matthew [name] he experienced some left inguinal pain radiated to the left scrotum left inner thigh and left testicle. The pain is intermittent is sharp and is moderate to severe. Apparently the pain worsens when is straining. No evidence of fevers and chills or vomiting. He recently went to his primary care and also to the emergency room to rule out testicular torsion ultrasound showed no evidence of torsion he was treated for possible epididymitis. Apparently he had had a cardiac catheter which were clean. No evidence of stents in the past. He is able to perform more than 4 Mets of activity without any shortness of breath or chest pain No Previous abdominal operations   Past Medical History Past Medical History  Diagnosis Date  . Coronary artery disease        Past Surgical History  Procedure Laterality Date  . Cardiac catheterization    . Bilateral carpal tunnel release    . Left hand surgery    . Colonoscopy with propofol N/A 02/28/2015    Procedure: COLONOSCOPY WITH PROPOFOL;  Surgeon: Josefine Class, MD;  Location: [facility] ENDOSCOPY;  Service: Endoscopy;  Laterality: N/A;    No Known Allergies  Current Outpatient Prescriptions  Medication Sig Dispense Refill  . aspirin EC 81 MG tablet Take 81 mg by mouth daily.    Marland Kitchen docusate sodium (COLACE) 100 MG capsule Take 1 tablet once or twice daily as needed for constipation while taking narcotic pain medicine 30 capsule 0  . HYDROcodone-acetaminophen (NORCO/VICODIN) 5-325 MG tablet 1-2 tabs po q 8 hours prn 6 tablet 0  . levofloxacin (LEVAQUIN) 500 MG tablet Take 1 tablet (500 mg total) by mouth daily. 10 tablet 0  . meloxicam (MOBIC) 15 MG tablet Take 15 mg by mouth daily.     No  current facility-administered medications for this visit.    Family History History reviewed. No pertinent family history.    Social History Social History  Substance Use Topics  . Smoking status: Former Smoker    Quit date: 12/14/1998  . Smokeless tobacco: None  . Alcohol Use: No     ROS 10 point ROS was performed and is otherwise negative  Physical Exam Blood pressure 145/102, pulse 72, temperature 97.7 F (36.5 C), temperature source Oral, height 5' 11.5" (1.816 m), weight 98.657 kg (217 lb 8 oz).  CONSTITUTIONAL: NAD well developed EYES: Pupils equal, round, and reactive to light, Sclera non-icteric. EARS, NOSE, MOUTH AND THROAT: The oropharynx is clear. Oral mucosa is pink and moist. Hearing is intact to voice.  NECK: Trachea is midline, and there is no jugular venous distension. Thyroid is without palpable abnormalities. LYMPH NODES:  Lymph nodes in the neck are not enlarged. RESPIRATORY:  Lungs are clear, and breath sounds are equal bilaterally. Normal respiratory effort without pathologic use of accessory muscles. CARDIOVASCULAR: Heart is regular without murmurs, gallops, or rubs. GI: The abdomen is  soft, and nondistended. There were no palpable masses. There was no hepatosplenomegaly. There were normal bowel sounds. Bilateral inguinal hernias, left tender to palpation, reducible. GU: Left testicle TTP, no torsion MUSCULOSKELETAL:  Normal muscle strength and tone in all four extremities.    SKIN: Skin turgor is normal. There are no pathologic skin  lesions.  NEUROLOGIC:  Motor and sensation is grossly normal.  Cranial nerves are grossly intact. PSYCH:  Alert and oriented to person, place and time. Affect is normal.  Data Reviewed  Assessment/Plan Symptomatic bilateral inguinal hernias in need for repair. His current situation more likely his ilioinguinal and iliohypogastric nerves are irritated secondary to the hernia. Discussed with the patient in detail that most  relevant risk factors for developing chronic pain after inguinal hernia repair is existent off previous pain. I do recommend upper scopic bilateral inguinal hernia repair with mesh given his symptoms. I'll like to wait until his antibiotics are finished. I have explained the procedure, risks, and aftercare of inguinal hernia repair to Matthew [name].   Risks include but are not limited to bleeding, infection, wound problems, anesthesia, recurrence, bladder or intestine injury, urinary retention, testicular dysfunction, chronic pain, mesh problems.  He  seems to understand and agrees to proceed.  Questions were answered to his stated satisfaction. Plan for lap BIH possible open.   Caroleen Hamman, MD [place] 12/16/2015, 7:32 PM

## 2015-12-29 NOTE — Transfer of Care (Signed)
Immediate Anesthesia Transfer of Care Note  Patient: Matthew [name]  Procedure(s) Performed: Procedure(s): LAPAROSCOPIC INGUINAL HERNIA (Bilateral)  Patient Location: PACU  Anesthesia Type:General  Level of Consciousness: awake and alert   Airway & Oxygen Therapy: Patient Spontanous Breathing and Patient connected to face mask oxygen  Post-op Assessment: Report given to RN and Post -op Vital signs reviewed and stable  Post vital signs: Reviewed and stable  Last Vitals:  Filed Vitals:   12/29/15 0958  BP: 143/98  Pulse: 77  Temp: 37 C  Resp: 16    Last Pain: There were no vitals filed for this visit.       Complications: No apparent anesthesia complications

## 2015-12-29 NOTE — Anesthesia Preprocedure Evaluation (Signed)
Anesthesia Evaluation  Patient identified by MRN, date of birth, ID band Patient awake    Reviewed: Allergy & Precautions, NPO status , Patient's Chart, lab work & pertinent test results  Airway Mallampati: II       Dental  (+) Teeth Intact   Pulmonary neg pulmonary ROS, former smoker,    breath sounds clear to auscultation       Cardiovascular + angina with exertion + CAD   Rhythm:Regular Rate:Normal     Neuro/Psych    GI/Hepatic Neg liver ROS, GERD  ,  Endo/Other  negative endocrine ROS  Renal/GU negative Renal ROS     Musculoskeletal negative musculoskeletal ROS (+)   Abdominal Normal abdominal exam  (+)   Peds  Hematology negative hematology ROS (+)   Anesthesia Other Findings   Reproductive/Obstetrics negative OB ROS                             Anesthesia Physical Anesthesia Plan  ASA: II  Anesthesia Plan: General   Post-op Pain Management:    Induction: Intravenous  Airway Management Planned: Oral ETT  Additional Equipment:   Intra-op Plan:   Post-operative Plan: Extubation in OR  Informed Consent: I have reviewed the patients History and Physical, chart, labs and discussed the procedure including the risks, benefits and alternatives for the proposed anesthesia with the patient or authorized representative who has indicated his/her understanding and acceptance.     Plan Discussed with: CRNA  Anesthesia Plan Comments:         Anesthesia Quick Evaluation

## 2015-12-29 NOTE — Op Note (Signed)
LaparoscopicBilateral Inguinal Hernia Repair  Matthew [name]  12/29/2015  Pre-operative Diagnosis: Bilateral Inguinal Hernias  Post-operative Diagnosis: Bilateral  Inguinal hernias  Procedure: Laparoscopic preperitoneal repair of bilateral inguinal hernias  Surgeon: Caroleen Hamman, MD FACS  Anesthesia: Gen. with endotracheal tube  Findings: Bilateral Direct Inguinal Hernias  Procedure Details  The patient was seen again in the Holding Room. The benefits, complications, treatment options, and expected outcomes were discussed with the patient. The risks of bleeding, infection, recurrence of symptoms, failure to resolve symptoms, recurrence of hernia, ischemic orchitis, chronic pain syndrome or neuroma, were discussed again. The likelihood of improving the patient's symptoms with return to their baseline status is good.  The patient and/or family concurred with the proposed plan, giving informed consent.  The patient was taken to Operating Room, identified as Matthew [name] and the procedure verified as Laparoscopic Inguinal Hernia Repair. Laterality confirmed.  A Time Out was held and the above information confirmed.  Prior to the induction of general anesthesia, antibiotic prophylaxis was administered. VTE prophylaxis was in place. General endotracheal anesthesia was then administered and tolerated well. After the induction, the abdomen was prepped with Chloraprep and draped in the sterile fashion. The patient was positioned in the supine position.  Local anesthetic  was injected into the skin near the umbilicus and an incision made. An incision was made and dissection down to the rectus fascia . The fascia was incised and the muscle retracted laterally. The Covidien dissecting balloon was placed followed by the structural balloon. The preperitoneal space was insufflated and under direct vision 2 midline 5 mm ports were placed.  Dissection was performed to delineate Cooper's ligament  and the lateral extent of dissection was determined on each side. The nerve on the lateral abdominal wall was identified and kept in view at all times. The cord was skeletonized of the indirect sac and cord lipoma which was retracted cephalad on each side.  The peritoneum was pushed cephalad to accommodate the mesh.  Once this was complete, a Bard 3D medium mesh was placed into the preperitoneal space on each side. They were held in place with the absorbable tacking device avoiding the area of the nerve. Once assuring that the hernias were completely repaired and adequately covered, the preperitoneal space was desufflated under direct vision. There was no sign of peritoneal rent and no sign of bowel intrusion towards the mesh.  Once assuring that hemostasis was adequate the ports were removed and a figure-of-eight 0 Vicryl suture was placed at the fascial edges. 4-0 subcuticular Monocryl was used at all skin edges. Steri-Strips and Mastisol and sterile dressings were placed.  Patient tolerated the procedure well. There were no complications. He was taken to the recovery room in stable condition.               Caroleen Hamman, MD, FACS

## 2015-12-29 NOTE — Anesthesia Procedure Notes (Signed)
Procedure Name: Intubation Performed by: Demetrius Charity Pre-anesthesia Checklist: Patient identified, Patient being monitored, Timeout performed, Emergency Drugs available and Suction available Patient Re-evaluated:Patient Re-evaluated prior to inductionOxygen Delivery Method: Circle system utilized Preoxygenation: Pre-oxygenation with 100% oxygen Intubation Type: IV induction Ventilation: Oral airway inserted - appropriate to patient size, Two handed mask ventilation required and Mask ventilation with difficulty Laryngoscope Size: Mac and 3 Grade View: Grade I Tube type: Oral Tube size: 7.0 mm Number of attempts: 1 Airway Equipment and Method: Stylet Placement Confirmation: ETT inserted through vocal cords under direct vision,  positive ETCO2 and breath sounds checked- equal and bilateral Secured at: 23 cm Tube secured with: Tape Dental Injury: Teeth and Oropharynx as per pre-operative assessment

## 2015-12-29 NOTE — Interval H&P Note (Signed)
History and Physical Interval Note:  12/29/2015 9:58 AM  Matthew [name]  has presented today for surgery, with the diagnosis of BILATERAL INGUINAL HERNIA  The various methods of treatment have been discussed with the patient and family. After consideration of risks, benefits and other options for treatment, the patient has consented to  Procedure(s): LAPAROSCOPIC INGUINAL HERNIA (Bilateral) as a surgical intervention .  The patient's history has been reviewed, patient examined, no change in status, stable for surgery.  I have reviewed the patient's chart and labs.  Questions were answered to the patient's satisfaction.     [place]

## 2015-12-29 NOTE — Discharge Instructions (Signed)

## 2015-12-30 ENCOUNTER — Encounter: Payer: Self-pay | Admitting: Surgery

## 2015-12-31 NOTE — Anesthesia Postprocedure Evaluation (Signed)
Anesthesia Post Note  Patient: Matthew [name]  Procedure(s) Performed: Procedure(s) (LRB): LAPAROSCOPIC INGUINAL HERNIA (Bilateral)  Patient location during evaluation: PACU Anesthesia Type: General Level of consciousness: awake Pain management: pain level controlled Vital Signs Assessment: post-procedure vital signs reviewed and stable Respiratory status: spontaneous breathing Cardiovascular status: blood pressure returned to baseline Anesthetic complications: no    Last Vitals:  Filed Vitals:   12/29/15 1607 12/29/15 1700  BP: 114/48 110/57  Pulse: 68 77  Temp:    Resp: 16 16    Last Pain:  Filed Vitals:   12/30/15 0840  PainSc: 6                  VAN STAVEREN,Navaya Wiatrek

## 2016-01-02 ENCOUNTER — Telehealth: Payer: Self-pay | Admitting: Surgery

## 2016-01-02 NOTE — Telephone Encounter (Signed)
I called patient and spoke with his wife. She was asked to take a picture of the incision site and text to my phone. She said she also forgot to mention on the previous call that her husband was unable to rotate is left foot or leg without excruciating pain. I told patient we would call her back after viewing the picture.

## 2016-01-02 NOTE — Telephone Encounter (Signed)
Patients wife, Marlowe Kays, called and stated that patient had surgery 5/11 and has some drainage at surgery site. Fever Saturday was 100.5 and Sunday 99.5. He also said he had a red streak coming from the incision site. Please call.

## 2016-01-03 NOTE — Telephone Encounter (Signed)
I spoke with patient and he was told to keep his regularly scheduled appointment for later this week. I told him that Dr. Azalee Course did not seem to think his inability to move his foot/leg outward was coming from the surgery. She seems to think it may be muscle skeletal related and she would address this at his appointment later this week. Patient verbalized understanding.

## 2016-01-03 NOTE — Telephone Encounter (Signed)
LVM for patient to call office.

## 2016-01-05 ENCOUNTER — Ambulatory Visit (INDEPENDENT_AMBULATORY_CARE_PROVIDER_SITE_OTHER): Payer: 59 | Admitting: Surgery

## 2016-01-05 ENCOUNTER — Encounter: Payer: Self-pay | Admitting: Surgery

## 2016-01-05 VITALS — BP 133/91 | HR 66 | Temp 98.3°F | Ht 71.0 in | Wt 209.0 lb

## 2016-01-05 DIAGNOSIS — Z09 Encounter for follow-up examination after completed treatment for conditions other than malignant neoplasm: Secondary | ICD-10-CM

## 2016-01-05 NOTE — Progress Notes (Signed)
S/p BIH, doing well, not using narcotics, Was sore for a few day but is now better Taking PO and doing well  PE NAD Abd: soft, incisions c/d/i,.no seroma or recurrence  A/P doing well  Still some soreness not unexpected No complications RTW May 30 RTC 2 weeks

## 2016-01-17 ENCOUNTER — Encounter: Payer: Self-pay | Admitting: Surgery

## 2016-01-18 ENCOUNTER — Ambulatory Visit (INDEPENDENT_AMBULATORY_CARE_PROVIDER_SITE_OTHER): Payer: 59 | Admitting: Surgery

## 2016-01-18 ENCOUNTER — Telehealth: Payer: Self-pay

## 2016-01-18 ENCOUNTER — Encounter: Payer: Self-pay | Admitting: Surgery

## 2016-01-18 VITALS — BP 139/99 | HR 85 | Temp 98.1°F | Wt 216.0 lb

## 2016-01-18 DIAGNOSIS — Z09 Encounter for follow-up examination after completed treatment for conditions other than malignant neoplasm: Secondary | ICD-10-CM

## 2016-01-18 NOTE — Telephone Encounter (Signed)
Patient's disability form was filled out. Patient stated that he will come in to the office after 1:30 PM to pick it up. Forms are left at the front desk.

## 2016-01-18 NOTE — Progress Notes (Signed)
S/p Lap BIH  Doing well Some mild intermittent pain Not on any narcotics Main issue is constipation Taking PO   PE NAD Abd : soft, incisions c/d/i, no infection, no recurrence  A/P doing well Offered a trial of Neurontin for pain but he states that his pain is not severe enough to warrant more meds Currently working and on light duty, may resume full activities in 2 weeks Instructed about miralax and suppository vs enema. He understands RTC prn

## 2016-04-23 ENCOUNTER — Emergency Department
Admission: EM | Admit: 2016-04-23 | Discharge: 2016-04-23 | Disposition: A | Discharge status: Home / Self Care | Payer: Managed Care, Other (non HMO) | Attending: Emergency Medicine | Admitting: Emergency Medicine

## 2016-04-23 ENCOUNTER — Emergency Department: Payer: Managed Care, Other (non HMO)

## 2016-04-23 DIAGNOSIS — I2510 Atherosclerotic heart disease of native coronary artery without angina pectoris: Secondary | ICD-10-CM | POA: Insufficient documentation

## 2016-04-23 DIAGNOSIS — Z7982 Long term (current) use of aspirin: Secondary | ICD-10-CM | POA: Insufficient documentation

## 2016-04-23 DIAGNOSIS — N401 Enlarged prostate with lower urinary tract symptoms: Secondary | ICD-10-CM | POA: Insufficient documentation

## 2016-04-23 DIAGNOSIS — Z79899 Other long term (current) drug therapy: Secondary | ICD-10-CM | POA: Diagnosis not present

## 2016-04-23 DIAGNOSIS — N50819 Testicular pain, unspecified: Secondary | ICD-10-CM | POA: Diagnosis present

## 2016-04-23 DIAGNOSIS — N433 Hydrocele, unspecified: Secondary | ICD-10-CM | POA: Diagnosis not present

## 2016-04-23 DIAGNOSIS — N390 Urinary tract infection, site not specified: Secondary | ICD-10-CM

## 2016-04-23 DIAGNOSIS — Z87891 Personal history of nicotine dependence: Secondary | ICD-10-CM | POA: Diagnosis not present

## 2016-04-23 DIAGNOSIS — N400 Enlarged prostate without lower urinary tract symptoms: Secondary | ICD-10-CM

## 2016-04-23 LAB — URINALYSIS COMPLETE WITH MICROSCOPIC (ARMC ONLY)
Bilirubin Urine: NEGATIVE
GLUCOSE, UA: NEGATIVE mg/dL
HGB URINE DIPSTICK: NEGATIVE
Ketones, ur: NEGATIVE mg/dL
LEUKOCYTES UA: NEGATIVE
Nitrite: NEGATIVE
Protein, ur: 100 mg/dL — AB
SQUAMOUS EPITHELIAL / LPF: NONE SEEN
Specific Gravity, Urine: 1.019 (ref 1.005–1.030)
pH: 7 (ref 5.0–8.0)

## 2016-04-23 LAB — BASIC METABOLIC PANEL
ANION GAP: 4 — AB (ref 5–15)
BUN: 14 mg/dL (ref 6–20)
CO2: 27 mmol/L (ref 22–32)
Calcium: 9 mg/dL (ref 8.9–10.3)
Chloride: 104 mmol/L (ref 101–111)
Creatinine, Ser: 0.88 mg/dL (ref 0.61–1.24)
GFR calc Af Amer: >60 mL/min (ref >60)
GLUCOSE: 120 mg/dL — AB (ref 65–99)
POTASSIUM: 4.1 mmol/L (ref 3.5–5.1)
Sodium: 135 mmol/L (ref 135–145)

## 2016-04-23 LAB — CBC
HCT: 41.2 % (ref 40.0–52.0)
Hemoglobin: 13.7 g/dL (ref 13.0–18.0)
MCH: 26.3 pg (ref 26.0–34.0)
MCHC: 33.3 g/dL (ref 32.0–36.0)
MCV: 79.1 fL — AB (ref 80.0–100.0)
PLATELETS: 203 10*3/uL (ref 150–440)
RBC: 5.21 MIL/uL (ref 4.40–5.90)
RDW: 14.1 % (ref 11.5–14.5)
WBC: 11.7 10*3/uL — AB (ref 3.8–10.6)

## 2016-04-23 MED ORDER — TRAMADOL HCL 50 MG PO TABS: 50.0000 mg | ORAL_TABLET | Freq: Four times a day (QID) | ORAL | 0 refills | Status: DC | PRN

## 2016-04-23 MED ORDER — MORPHINE SULFATE (PF) 4 MG/ML IV SOLN
4.0000 mg | Freq: Once | INTRAVENOUS | Status: AC
  Administered 2016-04-23: 4 mg via INTRAVENOUS
  Filled 2016-04-23: qty 1

## 2016-04-23 MED ORDER — CEPHALEXIN 500 MG PO CAPS: 500.0000 mg | ORAL_CAPSULE | Freq: Four times a day (QID) | ORAL | 0 refills | Status: AC

## 2016-04-23 MED ORDER — ONDANSETRON HCL 4 MG/2ML IJ SOLN
4.0000 mg | Freq: Once | INTRAMUSCULAR | Status: AC
  Administered 2016-04-23: 4 mg via INTRAVENOUS
  Filled 2016-04-23: qty 2

## 2016-04-23 MED ORDER — CEFTRIAXONE SODIUM 1 G IJ SOLR
1.0000 g | Freq: Once | INTRAMUSCULAR | Status: AC
  Administered 2016-04-23: 1 g via INTRAMUSCULAR
  Filled 2016-04-23: qty 10

## 2016-04-23 MED ORDER — KETOROLAC TROMETHAMINE 30 MG/ML IJ SOLN
30.0000 mg | Freq: Once | INTRAMUSCULAR | Status: DC
  Filled 2016-04-23: qty 1

## 2016-04-23 NOTE — ED Triage Notes (Signed)
Pt presents to ED with c/o bilateral groin pain that radiates into his testicles. Pt states he had inguinal hernial repair performed in May 10 at this hospital and has noted occasional left testicle pain. Pt reports sudden onset of his current symptoms. Denies any known injury or heavy lifting. Pt appears uncomfortable during triage.

## 2016-04-23 NOTE — ED Provider Notes (Signed)
[facility] Emergency Department Provider Note   ____________________________________________   None    (approximate)  I have reviewed the triage vital signs and the nursing notes.   HISTORY  Chief Complaint Groin Pain and Testicle Pain    HPI Matthew [name] is a 50 y.o. male who comes into the hospital today with severe groin and testicle pain. He reports his both testicles in the area above his penis. The patient reports that the pain started around 6 PM today. He has been hurting for several days but reports that it got worse. The patient reports that he had bilateral inguinal hernia surgery in May and was told he may have some occasional pain but he hasn't had any pain like this. The patient rates his pain 8 out of 10 in intensity. He did take some Aleve for pain earlier today and then around 1 AM took a Percocet for the pain. The patient denies any pain with urination and has some mid back pain that radiates out. The patient reports he couldn't tolerate the pain and thought his testicles may have been a little swollen so he decided to come into the hospital for evaluation.   Past Medical History:  Diagnosis Date  . Coronary artery disease   . GERD (gastroesophageal reflux disease)     Patient Active Problem List   Diagnosis Date Noted  . Bilateral recurrent inguinal hernia without obstruction or gangrene   . Bad memory 12/12/2012  . Speech and language deficits 12/12/2012    Past Surgical History:  Procedure Laterality Date  . BILATERAL CARPAL TUNNEL RELEASE    . CARDIAC CATHETERIZATION    . COLONOSCOPY WITH PROPOFOL N/A 02/28/2015   Procedure: COLONOSCOPY WITH PROPOFOL;  Surgeon: Josefine Class, MD;  Location: [facility] ENDOSCOPY;  Service: Endoscopy;  Laterality: N/A;  . INGUINAL HERNIA REPAIR Bilateral 12/29/2015   Procedure: LAPAROSCOPIC INGUINAL HERNIA;  Surgeon: Jules Husbands, MD;  Location: [facility] ORS;  Service: General;  Laterality:  Bilateral;  . left hand surgery      Prior to Admission medications   Medication Sig Start Date End Date Taking? Authorizing Provider  aspirin EC 81 MG tablet Take 81 mg by mouth daily.    Historical Provider, MD  cephALEXin (KEFLEX) 500 MG capsule Take 1 capsule (500 mg total) by mouth 4 (four) times daily. 04/23/16 05/03/16  Loney Hering, MD  docusate sodium (COLACE) 100 MG capsule Take 1 tablet once or twice daily as needed for constipation while taking narcotic pain medicine 12/07/15   Hinda Kehr, MD  ibuprofen (ADVIL,MOTRIN) 200 MG tablet Take 400 mg by mouth daily as needed.    Historical Provider, MD  lansoprazole (PREVACID) 15 MG capsule Take 15 mg by mouth daily at 12 noon.    Historical Provider, MD  traMADol (ULTRAM) 50 MG tablet Take 1 tablet (50 mg total) by mouth every 6 (six) hours as needed. 04/23/16   Loney Hering, MD    Allergies Review of patient's allergies indicates no known allergies.  Family History  Problem Relation Age of Onset  . Adopted: Yes  . Family history unknown: Yes    Social History Social History  Substance Use Topics  . Smoking status: Former Smoker    Quit date: 12/14/1998  . Smokeless tobacco: Never Used  . Alcohol use No    Review of Systems Constitutional: No fever/chills Eyes: No visual changes. ENT: No sore throat. Cardiovascular: Denies chest pain. Respiratory: Denies shortness of breath.  Gastrointestinal: No abdominal pain.  No nausea, no vomiting.  No diarrhea.  No constipation. Genitourinary: Bilateral testicle pain Musculoskeletal: back pain. Skin: Negative for rash. Neurological: Negative for headaches, focal weakness or numbness.  10-point ROS otherwise negative.  ____________________________________________   PHYSICAL EXAM:  VITAL SIGNS: ED Triage Vitals  Enc Vitals Group     BP 04/23/16 0234 (!) 147/92     Pulse Rate 04/23/16 0234 79     Resp 04/23/16 0234 20     Temp 04/23/16 0234 98.1 F (36.7 C)      Temp Source 04/23/16 0234 Oral     SpO2 04/23/16 0234 100 %     Weight 04/23/16 0235 210 lb (95.3 kg)     Height 04/23/16 0235 5\' 11"  (1.803 m)     Head Circumference --      Peak Flow --      Pain Score 04/23/16 0235 8     Pain Loc --      Pain Edu? --      Excl. in [place]? --     Constitutional: Alert and oriented. Well appearing and in Moderate to severe distress. Eyes: Conjunctivae are normal. PERRL. EOMI. Head: Atraumatic. Nose: No congestion/rhinnorhea. Mouth/Throat: Mucous membranes are moist.  Oropharynx non-erythematous. Cardiovascular: Normal rate, regular rhythm. Grossly normal heart sounds.  Good peripheral circulation. Respiratory: Normal respiratory effort.  No retractions. Lungs CTAB. Gastrointestinal: Soft and nontender. No distention. Positive bowel sounds Rectal: Prostate firm nontender and non-boggy no nodules palpated. Musculoskeletal: No lower extremity tenderness nor edema.   Neurologic:  Normal speech and language.  Skin:  Skin is warm, dry and intact.  Psychiatric: Mood and affect are normal.  ____________________________________________   LABS (all labs ordered are listed, but only abnormal results are displayed)  Labs Reviewed  URINALYSIS COMPLETEWITH MICROSCOPIC ([facility] ONLY) - Abnormal; Notable for the following:       Result Value   Color, Urine YELLOW (*)    APPearance HAZY (*)    Protein, ur 100 (*)    Bacteria, UA MANY (*)    All other components within normal limits  CBC - Abnormal; Notable for the following:    WBC 11.7 (*)    MCV 79.1 (*)    All other components within normal limits  BASIC METABOLIC PANEL - Abnormal; Notable for the following:    Glucose, Bld 120 (*)    Anion gap 4 (*)    All other components within normal limits  URINE CULTURE   ____________________________________________  EKG  none ____________________________________________  RADIOLOGY  Testicular ultrasound CT renal stone  study ____________________________________________   PROCEDURES  Procedure(s) performed: None  Procedures  Critical Care performed: No  ____________________________________________   INITIAL IMPRESSION / ASSESSMENT AND PLAN / ED COURSE  Pertinent labs & imaging results that were available during my care of the patient were reviewed by me and considered in my medical decision making (see chart for details).  This is a 50 year old male who comes into the hospital today with some back pain and testicle pain. The patient will have an ultrasound done and we did check some urine. The patient's urinalysis shows some bacteria but no significant amount of white blood cells or red blood cells. We will check some blood work as well as in the patient for CT renal stone study to evaluate for possible stone or other causes of his groin pain. The patient did have a prostate exam which was also unremarkable.  Clinical Course  Value Comment By  Time  US Scrotum 1. No evidence of testicular torsion. 2. Bilateral patent varicoceles noted. 3. Small to moderate right-sided hydrocele noted. 4. Bilateral epididymal head cysts noted.   Loney Hering, MD 09/04 [phone]  CT Renal Stone Study 1. No CT evidence for nephrolithiasis or obstructive uropathy. 2. No other acute intra-abdominal or pelvic process identified. 3. Enlarged prostate.   Loney Hering, MD 09/04 [phone]   The patient's CT scan is unremarkable. The patient does have many bacteria in his urine which could be the cause of his pain. His pain is improved after the morphine and he did not want any Toradol for his pain. The patient's blood work is unremarkable. I will discharge the patient to home after giving him a dose of ceftriaxone. He should follow-up with urology for further evaluation of this pain.  ____________________________________________   FINAL CLINICAL IMPRESSION(S) / ED DIAGNOSES  Final diagnoses:  Testicle pain   Hydrocele, bilateral  Enlarged prostate  UTI (lower urinary tract infection)      NEW MEDICATIONS STARTED DURING THIS VISIT:  New Prescriptions   CEPHALEXIN (KEFLEX) 500 MG CAPSULE    Take 1 capsule (500 mg total) by mouth 4 (four) times daily.   TRAMADOL (ULTRAM) 50 MG TABLET    Take 1 tablet (50 mg total) by mouth every 6 (six) hours as needed.     Note:  This document was prepared using Dragon voice recognition software and may include unintentional dictation errors.    Loney Hering, MD 04/23/16 [phone]

## 2016-04-23 NOTE — ED Notes (Signed)

## 2016-04-24 LAB — URINE CULTURE: CULTURE: NO GROWTH

## 2016-11-29 ENCOUNTER — Ambulatory Visit
Admission: EM | Admit: 2016-11-29 | Discharge: 2016-11-29 | Disposition: A | Discharge status: Home / Self Care | Payer: Managed Care, Other (non HMO) | Attending: Emergency Medicine | Admitting: Emergency Medicine

## 2016-11-29 ENCOUNTER — Encounter: Payer: Self-pay | Admitting: *Deleted

## 2016-11-29 ENCOUNTER — Ambulatory Visit (INDEPENDENT_AMBULATORY_CARE_PROVIDER_SITE_OTHER): Payer: Managed Care, Other (non HMO)

## 2016-11-29 DIAGNOSIS — M5431 Sciatica, right side: Secondary | ICD-10-CM | POA: Diagnosis not present

## 2016-11-29 MED ORDER — KETOROLAC TROMETHAMINE 60 MG/2ML IM SOLN
60.0000 mg | Freq: Once | INTRAMUSCULAR | Status: AC
  Administered 2016-11-29: 60 mg via INTRAMUSCULAR

## 2016-11-29 MED ORDER — METAXALONE 800 MG PO TABS: 800.0000 mg | ORAL_TABLET | Freq: Three times a day (TID) | ORAL | 0 refills | Status: DC

## 2016-11-29 MED ORDER — NAPROXEN 500 MG PO TABS: 500.0000 mg | ORAL_TABLET | Freq: Two times a day (BID) | ORAL | 0 refills | Status: DC

## 2016-11-29 NOTE — ED Triage Notes (Signed)
Awoke with right hip pain Saturday. Pain has worsened over past few days with difficulty bearing weight. Pt denies specific injury.

## 2016-11-29 NOTE — ED Triage Notes (Signed)
Pain radiates down right leg.

## 2016-11-29 NOTE — ED Provider Notes (Signed)
CSN: [id number]     Arrival date & time 11/29/16  4098 History   First MD Initiated Contact with Patient 11/29/16 1005     Chief Complaint  Patient presents with  . Hip Pain   (Consider location/radiation/quality/duration/timing/severity/associated sxs/prior Treatment) HPI  Subjective 51 year old male who presents with [facility] that down his posterior thigh to just below his knee. Does not involve his feet. He has no incontinence. 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 He states that he stopped that about 3 days ago but despite that he has had persistence of his pain. Certain motions seem to exacerbate it.       Past Medical History:  Diagnosis Date  . Coronary artery disease   . GERD (gastroesophageal reflux disease)    Past Surgical History:  Procedure Laterality Date  . BILATERAL CARPAL TUNNEL RELEASE    . CARDIAC CATHETERIZATION    . COLONOSCOPY WITH PROPOFOL N/A 02/28/2015   Procedure: COLONOSCOPY WITH PROPOFOL;  Surgeon: Josefine Class, MD;  Location: [facility] ENDOSCOPY;  Service: Endoscopy;  Laterality: N/A;  . INGUINAL HERNIA REPAIR Bilateral 12/29/2015   Procedure: LAPAROSCOPIC INGUINAL HERNIA;  Surgeon: Jules Husbands, MD;  Location: [facility] ORS;  Service: General;  Laterality: Bilateral;  . left hand surgery     Family History  Problem Relation Age of Onset  . Adopted: Yes  . Family history unknown: Yes   Social History  Substance Use Topics  . Smoking status: Former Smoker    Quit date: 12/14/1998  . Smokeless tobacco: Never Used  . Alcohol use No    Review of Systems  Constitutional: Positive for activity change. Negative for appetite change, chills, fatigue and fever.  Musculoskeletal: Positive for back pain and myalgias.  All other systems reviewed and are negative.   Allergies  Patient has no  known allergies.  Home Medications   Prior to Admission medications   Medication Sig Start Date End Date Taking? Authorizing Provider  aspirin EC 81 MG tablet Take 81 mg by mouth daily.   Yes Historical Provider, MD  ibuprofen (ADVIL,MOTRIN) 200 MG tablet Take 400 mg by mouth daily as needed.   Yes Historical Provider, MD  docusate sodium (COLACE) 100 MG capsule Take 1 tablet once or twice daily as needed for constipation while taking narcotic pain medicine 12/07/15   Hinda Kehr, MD  lansoprazole (PREVACID) 15 MG capsule Take 15 mg by mouth daily at 12 noon.    Historical Provider, MD  metaxalone (SKELAXIN) 800 MG tablet Take 1 tablet (800 mg total) by mouth 3 (three) times daily. 11/29/16   Lorin Picket, PA-C  naproxen (NAPROSYN) 500 MG tablet Take 1 tablet (500 mg total) by mouth 2 (two) times daily with a meal. 11/29/16   Lorin Picket, PA-C  traMADol (ULTRAM) 50 MG tablet Take 1 tablet (50 mg total) by mouth every 6 (six) hours as needed. 04/23/16   Loney Hering, MD   Meds Ordered and Administered this Visit   Medications  ketorolac (TORADOL) injection 60 mg (60 mg Intramuscular Given 11/29/16 1024)    BP (!) 133/91 (BP Location: Left Arm)   Pulse 63   Temp 98 F (36.7 C) (Oral)   Resp 16   Ht 6' (1.829 m)   Wt 210 lb (95.3 kg)  SpO2 99%   BMI 28.48 kg/m  No data found.   Physical Exam  Constitutional: He appears well-developed and well-nourished. No distress.  HENT:  Head: Normocephalic.  Eyes: Pupils are equal, round, and reactive to light.  Neck: Normal range of motion.  Musculoskeletal:  Examination of the lumbar spine shows the patient to have slightly forward and leftward list in stance. He is able to forward flex with his hands to the level of his knees. Returning to upright position is difficult and painful. He does have noticeable spasm in the L4-L5 right paraspinous muscles. He is able to toe and heel walk adequately. Sensation is intact distally. EHL  anterior tibialis and peroneal are strong to clinical testing. DTRs are 2+ over 4 and symmetrical bilaterally. Leg raise testing in the sitting position on the right at 45 with right buttock pain. On the left he is able to raise to 90 without any difficulty.  Skin: He is not diaphoretic.  Nursing note and vitals reviewed.   Urgent Care Course     Procedures (including critical care time)  Labs Review Labs Reviewed - No data to display  Imaging Review Dg Lumbar Spine Complete  Result Date: 11/29/2016 CLINICAL DATA:  Right-sided low back pain EXAM: LUMBAR SPINE - COMPLETE 4+ VIEW COMPARISON:  CT abdomen pelvis of 04/23/2016 FINDINGS: The lumbar vertebrae are in normal alignment. Mild degenerative disc disease at L3-4 is again noted with some loss of disc space and mild spurring. The remainder of intervertebral disc spaces appear normal. There is some degenerative change involving the facet joints of L4-5 and L5-S1. The SI joints are well corticated. IMPRESSION: 1. Mild degenerative disc disease at L3-4. 2. Degenerative change of the facet joints of L4-5 and L5-S1. Electronically Signed   By: Ivar Drape M.D.   On: 11/29/2016 11:03     Visual Acuity Review  Right Eye Distance:   Left Eye Distance:   Bilateral Distance:    Right Eye Near:   Left Eye Near:    Bilateral Near:     Medications  ketorolac (TORADOL) injection 60 mg (60 mg Intramuscular Given 11/29/16 1024)   Patient states that he did receive benefit from the injection with his pain lessened now been able to walk more steadily.   MDM   1. Sciatica of right side    Discharge Medication List as of 11/29/2016 11:22 AM    START taking these medications   Details  metaxalone (SKELAXIN) 800 MG tablet Take 1 tablet (800 mg total) by mouth 3 (three) times daily., Starting Thu 11/29/2016, Normal    naproxen (NAPROSYN) 500 MG tablet Take 1 tablet (500 mg total) by mouth 2 (two) times daily with a meal., Starting Thu  11/29/2016, Normal      Plan: 1. Test/x-ray results and diagnosis reviewed with patient 2. rx as per orders; risks, benefits, potential side effects reviewed with patient 3. Recommend supportive treatment with Rest and symptom avoidance. Patient will need to work on his core with exercises once he improves. He was not improving should follow-up with his primary care physician. 4. F/u prn if symptoms worsen or don't improve     Lorin Picket, PA-C 11/29/16 1125

## 2017-04-18 ENCOUNTER — Other Ambulatory Visit: Payer: Self-pay | Admitting: Neurology

## 2017-04-18 DIAGNOSIS — R4020 Unspecified coma: Secondary | ICD-10-CM

## 2017-04-18 DIAGNOSIS — R251 Tremor, unspecified: Secondary | ICD-10-CM

## 2017-04-25 ENCOUNTER — Ambulatory Visit
Admission: RE | Admit: 2017-04-25 | Discharge: 2017-04-25 | Disposition: A | Discharge status: Home / Self Care | Payer: Managed Care, Other (non HMO) | Source: Ambulatory Visit | Attending: Neurology | Admitting: Neurology

## 2017-04-25 DIAGNOSIS — R4020 Unspecified coma: Secondary | ICD-10-CM | POA: Insufficient documentation

## 2017-04-25 DIAGNOSIS — R251 Tremor, unspecified: Secondary | ICD-10-CM | POA: Insufficient documentation

## 2017-04-25 DIAGNOSIS — J328 Other chronic sinusitis: Secondary | ICD-10-CM | POA: Insufficient documentation

## 2017-04-25 MED ORDER — GADOBENATE DIMEGLUMINE 529 MG/ML IV SOLN
20.0000 mL | Freq: Once | INTRAVENOUS | Status: AC | PRN
  Administered 2017-04-25: 20 mL via INTRAVENOUS

## 2017-05-16 DIAGNOSIS — R55 Syncope and collapse: Secondary | ICD-10-CM | POA: Insufficient documentation

## 2017-05-16 DIAGNOSIS — I2510 Atherosclerotic heart disease of native coronary artery without angina pectoris: Secondary | ICD-10-CM | POA: Insufficient documentation

## 2017-06-19 ENCOUNTER — Ambulatory Visit
Admission: RE | Admit: 2017-06-19 | Discharge: 2017-06-19 | Disposition: A | Discharge status: Home / Self Care | Payer: Managed Care, Other (non HMO) | Source: Ambulatory Visit | Attending: Cardiology | Admitting: Cardiology

## 2017-06-19 ENCOUNTER — Encounter: Payer: Self-pay | Admitting: *Deleted

## 2017-06-19 ENCOUNTER — Encounter
Admission: RE | Disposition: A | Discharge status: Home / Self Care | Payer: Self-pay | Source: Ambulatory Visit | Attending: Cardiology

## 2017-06-19 DIAGNOSIS — Z7982 Long term (current) use of aspirin: Secondary | ICD-10-CM | POA: Insufficient documentation

## 2017-06-19 DIAGNOSIS — Z8673 Personal history of transient ischemic attack (TIA), and cerebral infarction without residual deficits: Secondary | ICD-10-CM | POA: Diagnosis not present

## 2017-06-19 DIAGNOSIS — I2510 Atherosclerotic heart disease of native coronary artery without angina pectoris: Secondary | ICD-10-CM | POA: Insufficient documentation

## 2017-06-19 DIAGNOSIS — K219 Gastro-esophageal reflux disease without esophagitis: Secondary | ICD-10-CM | POA: Diagnosis not present

## 2017-06-19 DIAGNOSIS — R55 Syncope and collapse: Secondary | ICD-10-CM | POA: Insufficient documentation

## 2017-06-19 DIAGNOSIS — E785 Hyperlipidemia, unspecified: Secondary | ICD-10-CM | POA: Insufficient documentation

## 2017-06-19 HISTORY — PX: LOOP RECORDER INSERTION: EP1214

## 2017-06-19 SURGERY — LOOP RECORDER INSERTION
Anesthesia: LOCAL

## 2017-06-19 MED ORDER — LIDOCAINE-EPINEPHRINE (PF) 1 %-1:200000 IJ SOLN
INTRAMUSCULAR | Status: DC | PRN
  Administered 2017-06-19: 20 mL via INTRADERMAL

## 2017-06-19 MED ORDER — LIDOCAINE-EPINEPHRINE (PF) 1 %-1:200000 IJ SOLN
INTRAMUSCULAR | Status: AC
  Filled 2017-06-19: qty 30

## 2017-06-19 SURGICAL SUPPLY — 2 items
LOOP REVEAL LINQSYS (Prosthesis & Implant Heart) ×2 IMPLANT
PACK LOOP INSERTION (CUSTOM PROCEDURE TRAY) ×2 IMPLANT

## 2017-06-26 DIAGNOSIS — G4733 Obstructive sleep apnea (adult) (pediatric): Secondary | ICD-10-CM | POA: Insufficient documentation

## 2017-09-20 ENCOUNTER — Ambulatory Visit
Admission: EM | Admit: 2017-09-20 | Discharge: 2017-09-20 | Disposition: A | Discharge status: Home / Self Care | Payer: Managed Care, Other (non HMO) | Attending: Family Medicine | Admitting: Family Medicine

## 2017-09-20 ENCOUNTER — Other Ambulatory Visit: Payer: Self-pay

## 2017-09-20 ENCOUNTER — Encounter: Payer: Self-pay | Admitting: *Deleted

## 2017-09-20 DIAGNOSIS — R0981 Nasal congestion: Secondary | ICD-10-CM

## 2017-09-20 DIAGNOSIS — R05 Cough: Secondary | ICD-10-CM

## 2017-09-20 DIAGNOSIS — J111 Influenza due to unidentified influenza virus with other respiratory manifestations: Secondary | ICD-10-CM

## 2017-09-20 DIAGNOSIS — R509 Fever, unspecified: Secondary | ICD-10-CM | POA: Diagnosis not present

## 2017-09-20 DIAGNOSIS — R69 Illness, unspecified: Secondary | ICD-10-CM | POA: Diagnosis not present

## 2017-09-20 DIAGNOSIS — J029 Acute pharyngitis, unspecified: Secondary | ICD-10-CM | POA: Diagnosis not present

## 2017-09-20 HISTORY — DX: Migraine, unspecified, not intractable, without status migrainosus: G43.909

## 2017-09-20 LAB — RAPID STREP SCREEN (MED CTR MEBANE ONLY): STREPTOCOCCUS, GROUP A SCREEN (DIRECT): NEGATIVE

## 2017-09-20 MED ORDER — OSELTAMIVIR PHOSPHATE 75 MG PO CAPS: 75.0000 mg | ORAL_CAPSULE | Freq: Two times a day (BID) | ORAL | 0 refills | Status: DC

## 2017-09-20 NOTE — ED Triage Notes (Signed)
Patient started having nasal congestion / drainage, body aches, chills and scratchy throat 2 days ago.

## 2017-09-20 NOTE — ED Provider Notes (Signed)
[facility]    CSN: [id number] Arrival date & time: 09/20/17  1432  History   Chief Complaint Chief Complaint  Patient presents with  . Nasal Congestion  . Fever   HPI  52 year old male presents with chills, body aches, cough, runny nose.  Patient states that he is been sick since Wednesday.  He reports chills, body aches, cough, runny nose.  No documented fever.  He states that his temperature has not been elevated at home.  He has had sick contacts with influenza at work.  No known exacerbating or relieving factors.  States that he feels extremely poorly.  He reports associated sore throat as well.  No other associated symptoms.  No other complaints at this time.  Past Medical History:  Diagnosis Date  . Coronary artery disease   . GERD (gastroesophageal reflux disease)   . Migraines    Patient Active Problem List   Diagnosis Date Noted  . Bilateral recurrent inguinal hernia without obstruction or gangrene   . Bad memory 12/12/2012  . Speech and language deficits 12/12/2012   Past Surgical History:  Procedure Laterality Date  . BILATERAL CARPAL TUNNEL RELEASE    . CARDIAC CATHETERIZATION    . COLONOSCOPY WITH PROPOFOL N/A 02/28/2015   Procedure: COLONOSCOPY WITH PROPOFOL;  Surgeon: Josefine Class, MD;  Location[facility] ENDOSCOPY;  Service: Endoscopy;  Laterality: N/A;  . HERNIA REPAIR    . INGUINAL HERNIA REPAIR Bilateral 12/29/2015   Procedure: LAPAROSCOPIC INGUINAL HERNIA;  Surgeon: Jules Husbands, MD;  Location: [facility] ORS;  Service: General;  Laterality: Bilateral;  . left hand surgery    . LOOP RECORDER INSERTION N/A 06/19/2017   Procedure: LOOP RECORDER INSERTION;  Surgeon: Isaias Cowman, MD;  Location: [facility];  Service: Cardiovascular;  Laterality: N/A;    Home Medications    Prior to Admission medications   Medication Sig Start Date End Date Taking? Authorizing Provider  aspirin EC 81 MG tablet Take 81 mg by mouth daily.   Yes  [provider]  ibuprofen (ADVIL,MOTRIN) 200 MG tablet Take 400 mg by mouth daily as needed.   Yes [provider]  ranitidine (ZANTAC) 150 MG capsule Take 150 mg by mouth daily.   Yes [provider]  topiramate (TOPAMAX) 25 MG tablet Take 25 mg by mouth daily.   Yes [provider]  oseltamivir (TAMIFLU) 75 MG capsule Take 1 capsule (75 mg total) by mouth every 12 (twelve) hours. 09/20/17   Coral Spikes, DO    Family History Family History  Adopted: Yes  Family history unknown: Yes    Social History Social History   Tobacco Use  . Smoking status: Former Smoker    Last attempt to quit: 12/14/1998    Years since quitting: 18.7  . Smokeless tobacco: Never Used  Substance Use Topics  . Alcohol use: No    Alcohol/week: 0.0 oz  . Drug use: No     Allergies   Patient has no known allergies.   Review of Systems Review of Systems  Constitutional: Positive for chills.  HENT: Positive for rhinorrhea and sore throat.   Respiratory: Positive for cough.   Musculoskeletal:       Body aches.   Physical Exam Triage Vital Signs ED Triage Vitals  Enc Vitals Group     BP 09/20/17 1436 (!) 149/95     Pulse Rate 09/20/17 1436 79     Resp 09/20/17 1436 16     Temp  09/20/17 1436 98.7 F (37.1 C)     Temp Source 09/20/17 1436 Oral     SpO2 09/20/17 1436 100 %     Weight 09/20/17 1438 218 lb (98.9 kg)     Height 09/20/17 1438 5' 11.5" (1.816 m)     Head Circumference --      Peak Flow --      Pain Score --      Pain Loc --      Pain Edu? --      Excl. in [place]? --    Updated Vital Signs BP (!) 149/95 (BP Location: Left Arm)   Pulse 79   Temp 98.7 F (37.1 C) (Oral)   Resp 16   Ht 5' 11.5" (1.816 m)   Wt 218 lb (98.9 kg)   SpO2 100%   BMI 29.98 kg/m   Physical Exam  Constitutional: He is oriented to person, place, and time. He appears well-developed.  Appears fatigued/mildly ill.  HENT:  Oropharynx with moderate erythema.  No exudate.   Eyes: Conjunctivae are normal. Right eye exhibits no discharge. Left eye exhibits no discharge.  Neck: Neck supple.  Cardiovascular: Normal rate and regular rhythm.  Pulmonary/Chest: Effort normal and breath sounds normal.  Lymphadenopathy:    He has no cervical adenopathy.  Neurological: He is alert and oriented to person, place, and time.  Psychiatric: He has a normal mood and affect. His behavior is normal.  Nursing note and vitals reviewed.  UC Treatments / Results  Labs (all labs ordered are listed, but only abnormal results are displayed) Labs Reviewed  RAPID STREP SCREEN (NOT AT [facility])  CULTURE, GROUP A STREP [facility])    EKG  EKG Interpretation None       Radiology No results found.  Procedures Procedures (including critical care time)  Medications Ordered in UC Medications - No data to display   Initial Impression / Assessment and Plan / UC Course  I have reviewed the triage vital signs and the nursing notes.  Pertinent labs & imaging results that were available during my care of the patient were reviewed by me and considered in my medical decision making (see chart for details).     52 year old male presents with an influenza-like illness, possibly influenza.  Recent exposure.  Treating with Tamiflu.  Final Clinical Impressions(s) / UC Diagnoses   Final diagnoses:  Influenza-like illness    ED Discharge Orders        Ordered    oseltamivir (TAMIFLU) 75 MG capsule  Every 12 hours     09/20/17 1532     Controlled Substance Prescriptions [place] Controlled Substance Registry consulted? Not Applicable   Coral Spikes, DO 09/20/17 1544

## 2017-09-20 NOTE — Discharge Instructions (Signed)
Medication as prescribed. ° °Rest, fluids. ° °Take care ° °Dr. Faven Watterson  °

## 2017-09-23 LAB — CULTURE, GROUP A STREP (THRC)

## 2017-10-08 NOTE — Progress Notes (Signed)
 Established Patient Visit   Chief Complaint: Chief Complaint  Patient presents with  . Follow-up  . Chest Pain   Date of Service: 10/08/2017 Date of Birth: [birth year] PCP: Buren Rock Browning, MD  History of Present Illness: Matthew [name] is a 52 y.o.male patient who returns for   1.  Recurrent syncope  2.  History of CAD, status post cardiac catheterization 2003 and 2005  3.  Hyperlipidemia  4.  Questionable history of prior stroke 2013  5.  Obstructive sleep apnea  The patient was seen on 05/16/2017 following syncope on 03/29/2017, and recurrent syncope 05/07/2017.  Neurological workup including MRI of the brain was unremarkable.  24-hour Holter monitor revealed predominant normal sinus rhythm with a mean heart rate of 80 bpm, infrequent premature ventricular contractions and premature atrial contractions, a 5 beat atrial run, and a 3 beat ventricular run.  Stress echocardiogram was performed 06/04/2017.  The patient exercised 12 minutes on a Bruce protocol without chest pain or ECG changes.  At baseline 2D echocardiogram revealed normal left ventricular function with LV ejection fraction greater than 55%.  At peak exercise there was appropriate augmentation of all myocardial segments without evidence for scar or ischemia. The patient underwent successful Linq implantation on 06/19/2017.  The patient returns today for follow-up and reports doing relatively well.  He had a recent episode of atypical, nonexertional chest discomfort while watching television with associated diaphoresis, without radiation or shortness of breath.  The episode lasted 2-3 minutes and resolved on its own.  He had a similar episode about a month and a half ago while lying in bed.  He denies exertional chest pain.  Recent Linq interrogation, including on the date of his episode of chest pain recently, has revealed no significant events including pauses, bradycardia, tachycardia, or arrhythmia. Of note, the patient had a  recent death in the family.  The patient reports overall improvement in his symptoms since using his CPAP for the last 3 months.  He denies recurrent presyncope or syncope.  He denies shortness of breath.  He denies peripheral edema.  He has occasional palpitations without prolonged heart racing. He underwent sleep study which revealed AHI/REI of 22.6 on 06/17/2017 and 31.1 on 06/18/2017 consistent with moderate to severe obstructive sleep apnea.    Past Medical and Surgical History  Past Medical History Past Medical History:  Diagnosis Date  . CAD (coronary artery disease)   . External hemorrhoids 02/28/2015  . GERD (gastroesophageal reflux disease) 08/2008  . Hyperlipidemia, unspecified   . Hypertension 03/2017  . Internal hemorrhoids 02/28/2015  . Sleep apnea, obstructive 06/26/2017  . Stroke (CMS-HCC)   . Syncope and collapse 03/2017  . Tubular adenoma of colon, unspecified 02/28/2015    Past Surgical History He has a past surgical history that includes Colonoscopy (02/28/2015); Inguinal hernia repair; and Cardiac catheterization (08/2003).   Medications and Allergies  Current Medications  Current Outpatient Medications  Medication Sig Dispense Refill  . aspirin  81 MG chewable tablet Take 81 mg by mouth daily.    . aspirin /acetaminophen /caffeine (EXCEDRIN MIGRAINE ORAL) Take by mouth.    . etodolac (LODINE) 500 MG tablet Take 1 tablet (500 mg total) by mouth 2 (two) times daily 60 tablet 1  . fluticasone (FLONASE) 50 mcg/actuation nasal spray Place 2 sprays into both nostrils once daily    . lansoprazole (PREVACID) 15 MG DR capsule Take by mouth.    . magnesium oxide (MAG-OX) 400 mg (241.3 mg magnesium) tablet Take by mouth    .  naproxen  (NAPROSYN ) 500 MG tablet Take by mouth.    SABRA PREDNISONE ORAL Take by mouth    . topiramate (TOPAMAX) 50 MG tablet Take 1 tablet (50 mg total) by mouth nightly (Patient taking differently: Take 50 mg by mouth 2 (two) times daily  ) 90 tablet 3   No  current facility-administered medications for this visit.     Allergies: Patient has no known allergies.  Social and Family History  Social History  reports that he quit smoking about 6 years ago. His smoking use included cigarettes. He started smoking about 38 years ago. He has a 25.00 pack-year smoking history. He quit smokeless tobacco use about 13 years ago. He reports that he does not drink alcohol or use drugs.  Family History Family History  Problem Relation Age of Onset  . Myocardial Infarction (Heart attack) Brother   . Coronary Artery Disease (Blocked arteries around heart) Brother   . COPD Brother   . Sleep disorder Paternal Aunt     Review of Systems   Review of Systems: The patient reports intermittent chest pain, without shortness of breath, orthopnea, paroxysmal nocturnal dyspnea, pedal edema, with occasioanl palpitations, without heart racing, presyncope, syncope. Review of 12 Systems is negative except as described above.  Physical Examination   Vitals:BP 132/68   Pulse 66   Ht 180.3 cm (5' 11)   Wt (!) 100.8 kg (222 lb 3.2 oz)   BMI 30.99 kg/m  Ht:180.3 cm (5' 11) Wt:(!) 100.8 kg (222 lb 3.2 oz) ADJ:Anib surface area is 2.25 meters squared. Body mass index is 30.99 kg/m.  General: Alert and oriented. Well-appearing. No acute distress. HEENT: Pupils equally reactive to light and accomodation    Neck: Supple, no JVD Lungs: Normal effort of breathing; clear to auscultation bilaterally; no wheezes, rales, rhonchi Heart: Regular rate and rhythm. No murmur, rub, or gallop Abdomen: nondistended, with normal bowel sounds Extremities: no edema Peripheral Pulses: 2+ radial  Skin: Warm, dry, no diaphoresis  Assessment   52 y.o. male with  1. Coronary artery disease involving native coronary artery of native heart without angina pectoris   2. Syncope and collapse   3. Sleep apnea, obstructive   4. Chest pain with high risk for cardiac etiology, unspecified     52 year old gentleman with undocumented history of coronary artery disease, questionable history of prior stroke, with 2 recent syncopal episodes, with unremarkable MRI of the brain, unremarkable 24-hour Holter monitor, and normal stress echocardiogram.  The patient underwent successful implantation of Linq monitor device.  Home sleep study was consistent with moderate to severe obstructive sleep apnea with history of generalized fatigue and excessive daytime sleepiness. The patient reports improvement in his symptoms overall after continued use of his CPAP. He denies recurrence of presyncope or syncope. Recent Linq interrogation did not reveal evidence of concerning episodes. The patient has had 2 episodes of atypical, nonexertional, brief chest discomfort, without exertional chest pain.   Plan   1.  Continue current medications 2.  Continue regular CPAP use 3.  Continue to monitor Linq device 4.  Return to clinic for follow-up in 3 months  No orders of the defined types were placed in this encounter.   Return in about 3 months (around 01/05/2018). I personally performed the service, non-incident to.  [facility]) The exam findings, history, and plan were discussed with Dr. Ammon who was available for collaboration. ANNA MARIA DRANE, PA-C

## 2018-04-09 IMAGING — CT CT RENAL STONE PROTOCOL
2 of 4 series · 16 of 46 positions shown, 18 images · non-contrast
Comparison: Prior testicular ultrasound performed earlier the same
day.

CLINICAL DATA: Initial evaluation for acute bilateral groin and
abdominal pain.

EXAM:
CT ABDOMEN AND PELVIS WITHOUT CONTRAST
TECHNIQUE: Multidetector CT imaging of the abdomen and pelvis was performed
following the standard protocol without IV contrast.

[Series 2: axial st · axial · 0.80mm/px · z∈[-548,-83]mm · 13 of 103 slices shown, 15 images]
[im 5/103  soft-tissue]
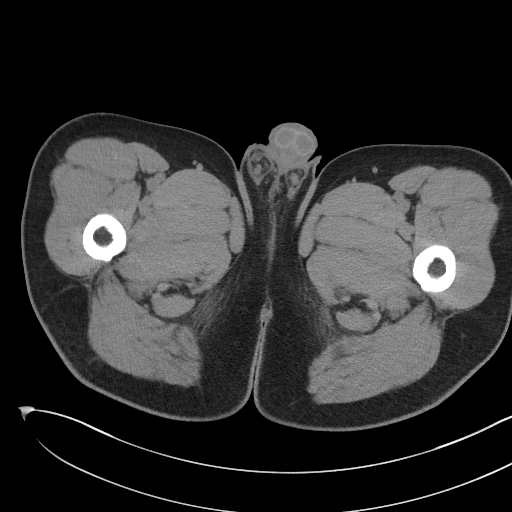
[im 5/103  bone]
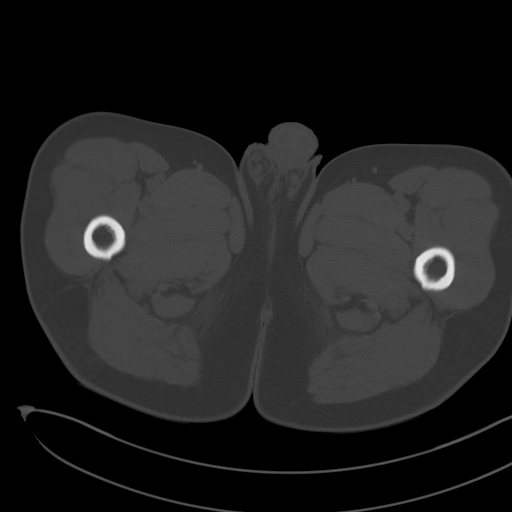
[im 13/103  soft-tissue]
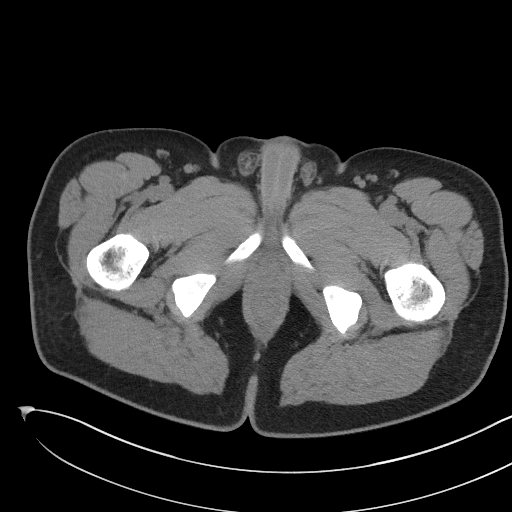
[im 22/103  soft-tissue]
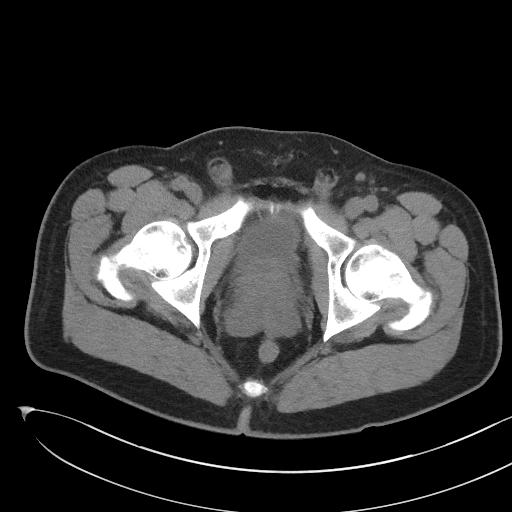
[im 30/103  soft-tissue]
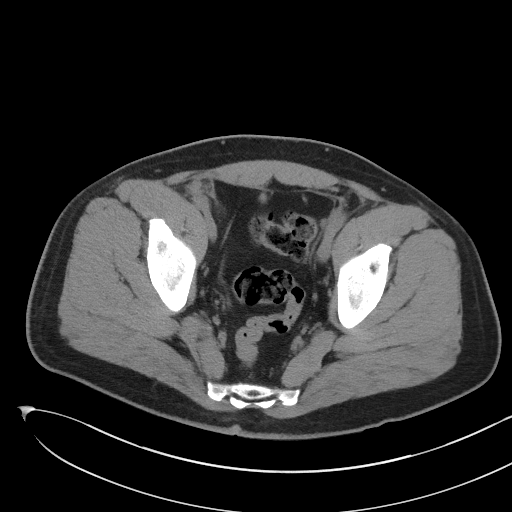
[im 35/103  soft-tissue]
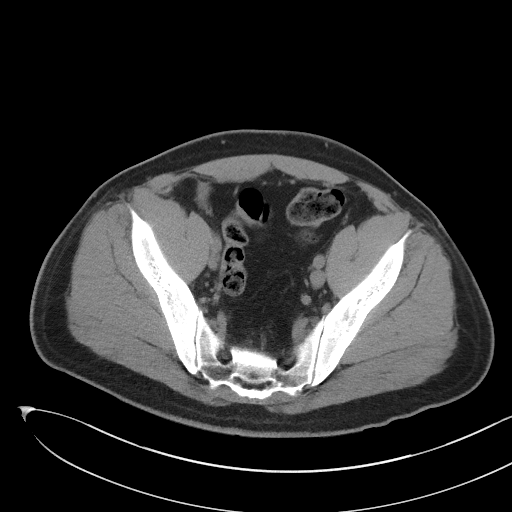
[im 43/103  soft-tissue]
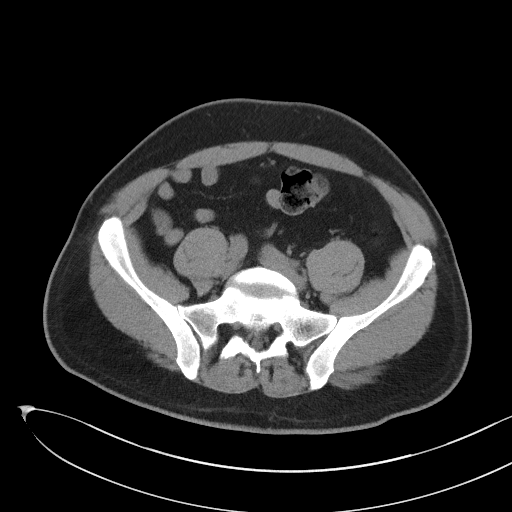
[im 52/103  soft-tissue]
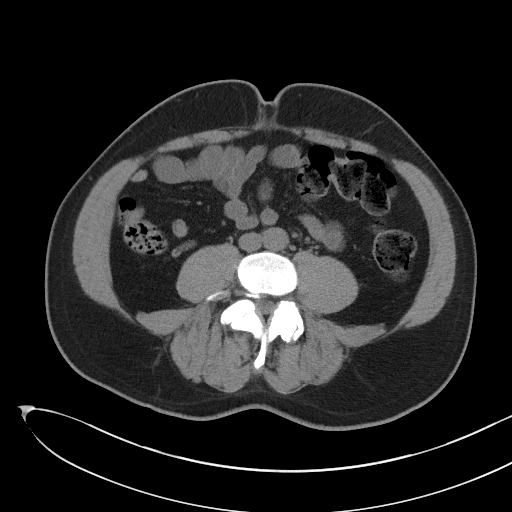
[im 60/103  soft-tissue]
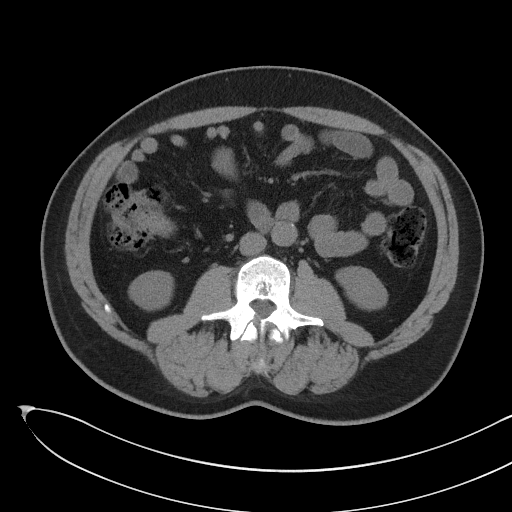
[im 69/103  soft-tissue]
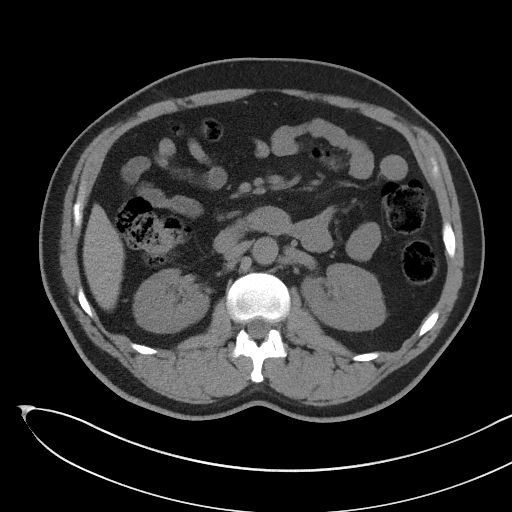
[im 69/103  bone]
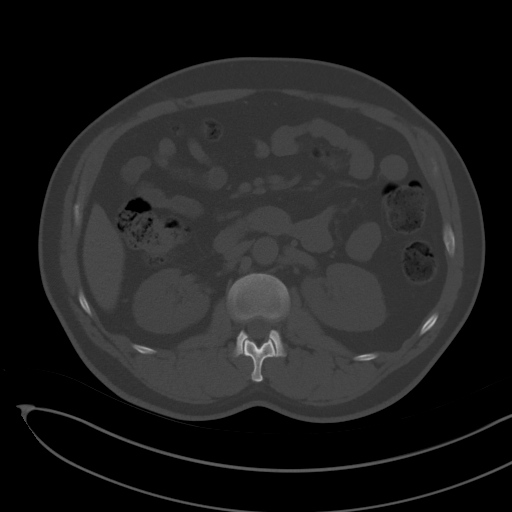
[im 73/103  soft-tissue]
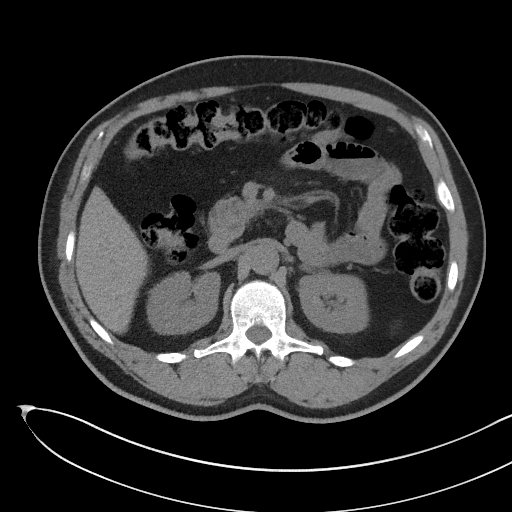
[im 81/103  soft-tissue]
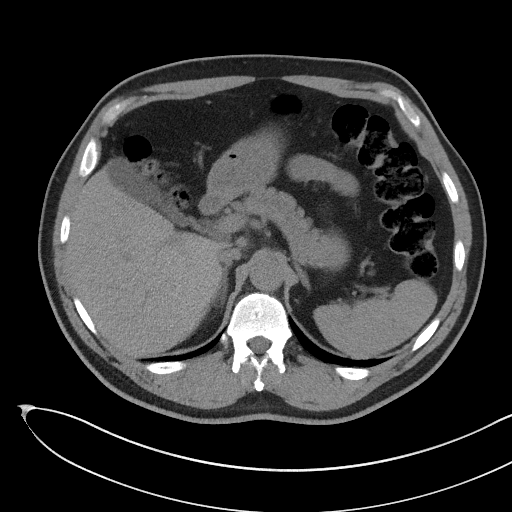
[im 90/103  soft-tissue]
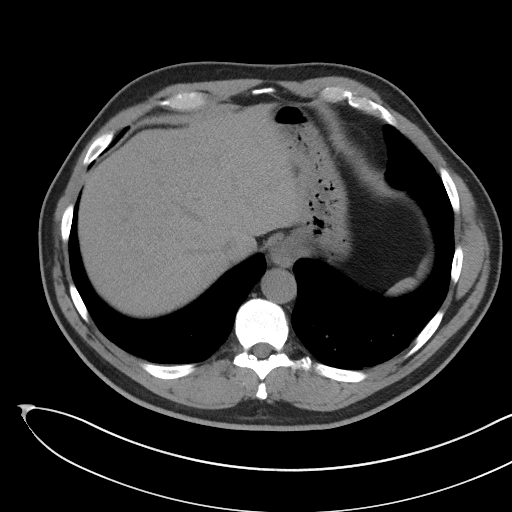
[im 98/103  soft-tissue]
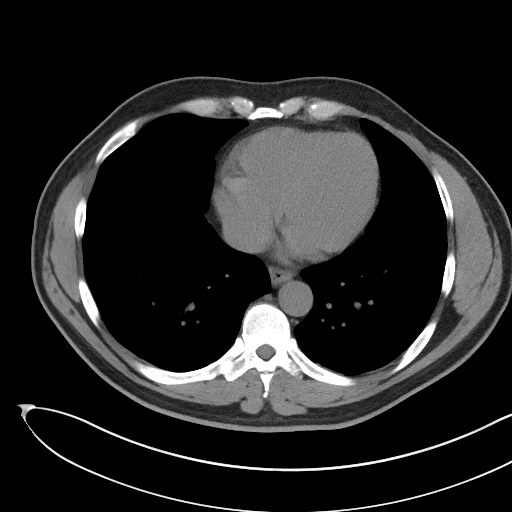

[Series 5: coronal · coronal · 0.91mm/px · 3 of 132 slices shown]
[im 44/132  soft-tissue]
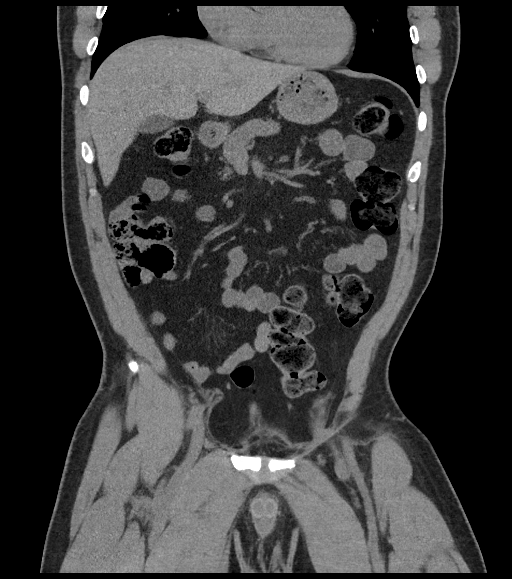
[im 59/132  soft-tissue]
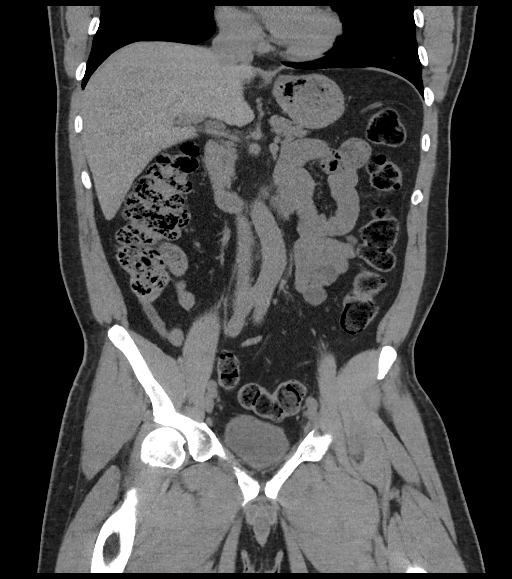
[im 73/132  soft-tissue]
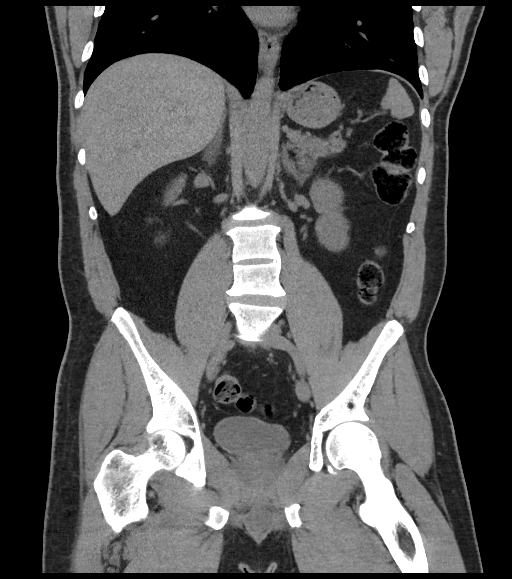

[16 of 46 positions shown; findings below may reference images not displayed]

FINDINGS: Mild subsegmental atelectasis seen dependently within the visualized
lung bases. Visualized lungs are otherwise clear.

Liver demonstrates a normal unenhanced appearance. Gallbladder
within normal limits. No biliary dilatation. Spleen, adrenal glands,
and pancreas demonstrate a normal unenhanced appearance.

Kidneys are equal in size without evidence for nephrolithiasis or
hydronephrosis. No radiopaque calculi seen along the course of
either renal collecting system. There is no hydroureter.

Stomach within normal limits. No evidence for bowel obstruction.
Appendix well visualized in the right lower quadrant and is of
normal caliber and appearance without associated inflammatory
changes to suggest acute appendicitis. No abnormal wall thickening
or inflammatory fat stranding seen elsewhere about the bowels.
Single calcified sigmoid diverticulum noted.

Bladder within normal limits without acute abnormality. No layering
stones within the bladder lumen. Prostate enlarged measuring up to
5.5 cm in diameter.

No free air or fluid. No pathologically enlarged intra-abdominal or
pelvic lymph nodes identified.

No acute osseous abnormality. No worrisome lytic or blastic osseous
lesions. Trace retrolisthesis of L2 on L3 noted. Degenerative disc
bulge present at L4-5 and L5-S1. Chronic unilateral right-sided pars
defect noted at L4.

Hazy soft tissue stranding within the subcutaneous fat of the
paraumbilical region noted, which may be related to scarring.
IMPRESSION: 1. No CT evidence for nephrolithiasis or obstructive uropathy.
2. No other acute intra-abdominal or pelvic process identified.
3. Enlarged prostate.

## 2018-07-03 IMAGING — US US SCROTUM
1 series · 13 of 25 positions shown · non-contrast
Comparison: Scrotal ultrasound performed 12/07/2015

CLINICAL DATA: Acute onset of lower abdominal and generalized
testicular pain. Initial encounter.

EXAM:
SCROTAL ULTRASOUND
DOPPLER ULTRASOUND OF THE TESTICLES
TECHNIQUE: Complete ultrasound examination of the testicles, epididymis, and
other scrotal structures was performed. Color and spectral Doppler
ultrasound were also utilized to evaluate blood flow to the
testicles.

[Series 1: us scrotum · 0.08mm/px · 13 of 80 slices shown]
[im 1/80]
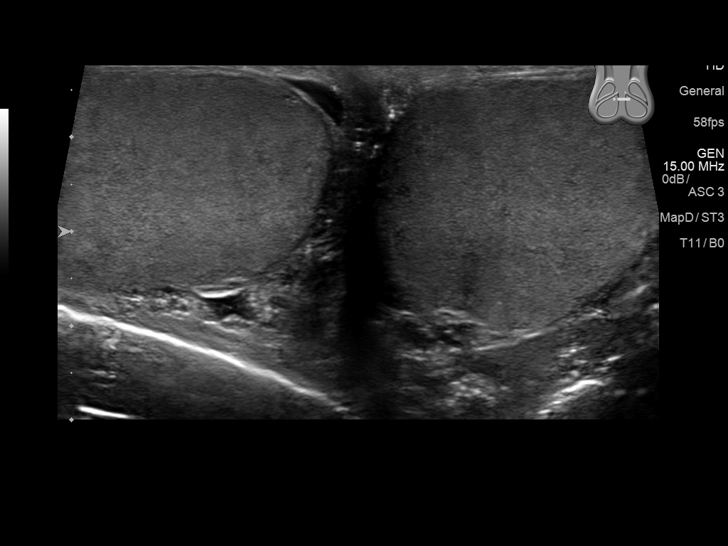
[im 7/80]
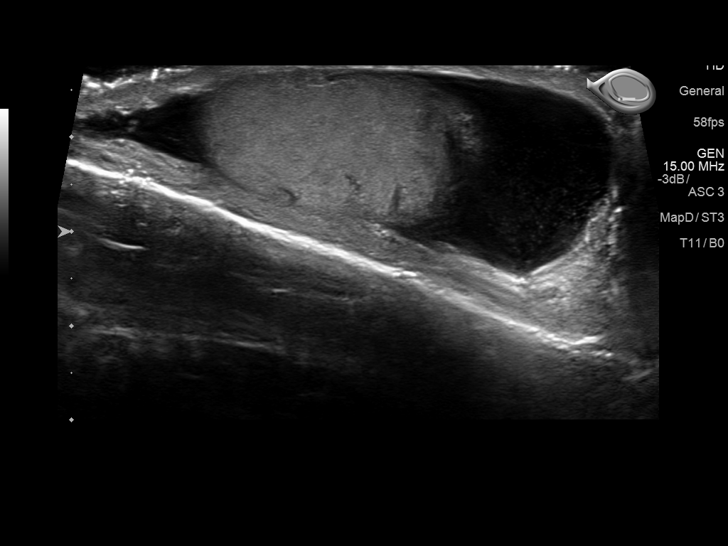
[im 14/80]
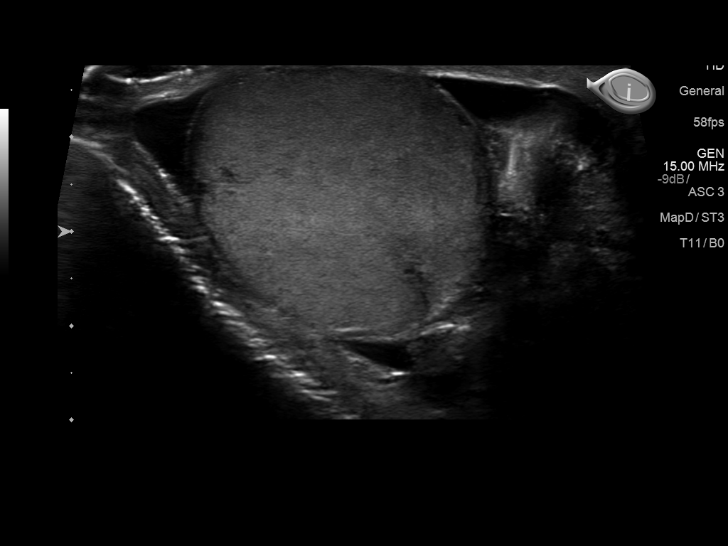
[im 20/80]
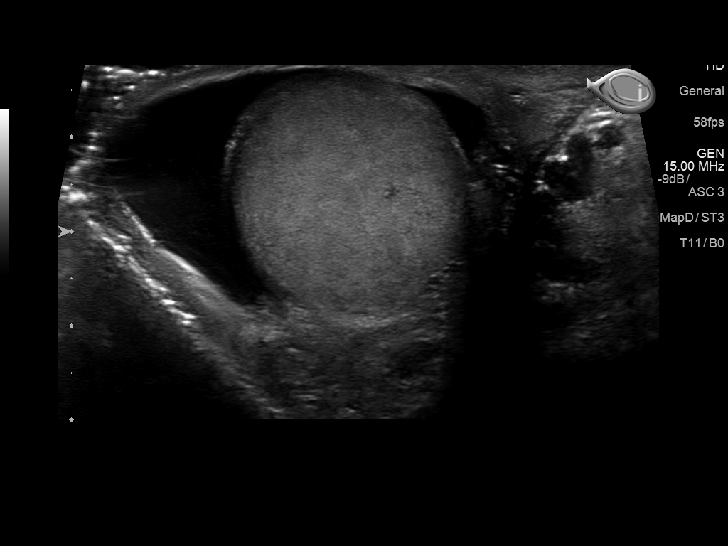
[im 27/80]
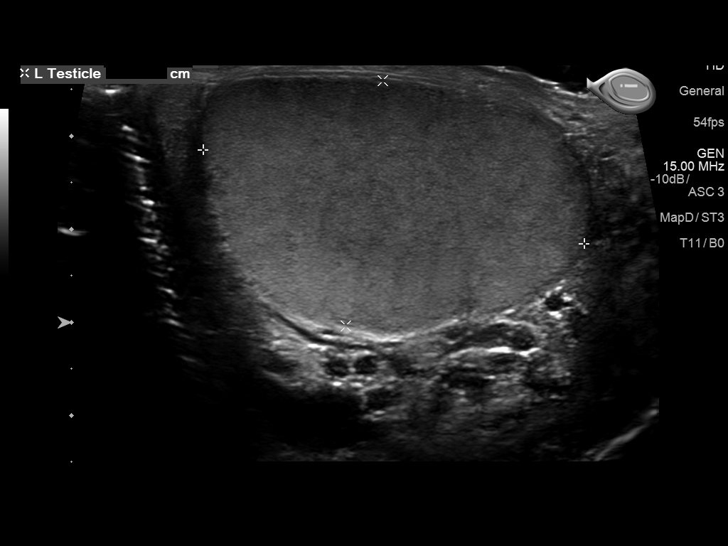
[im 33/80]
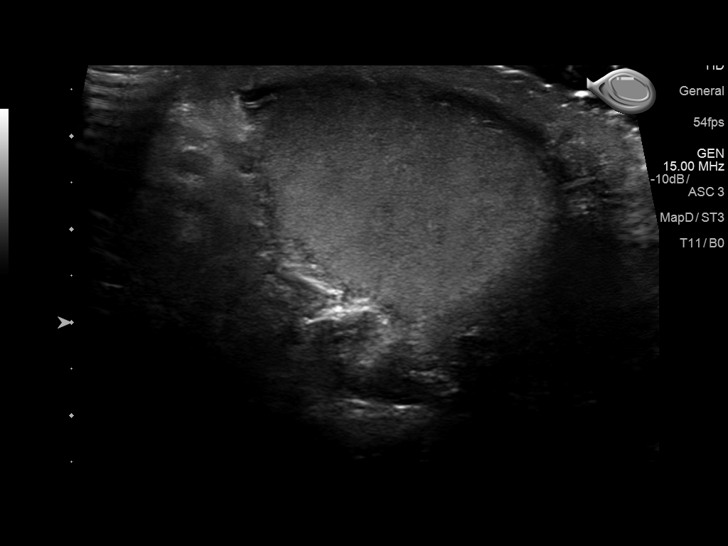
[im 40/80]
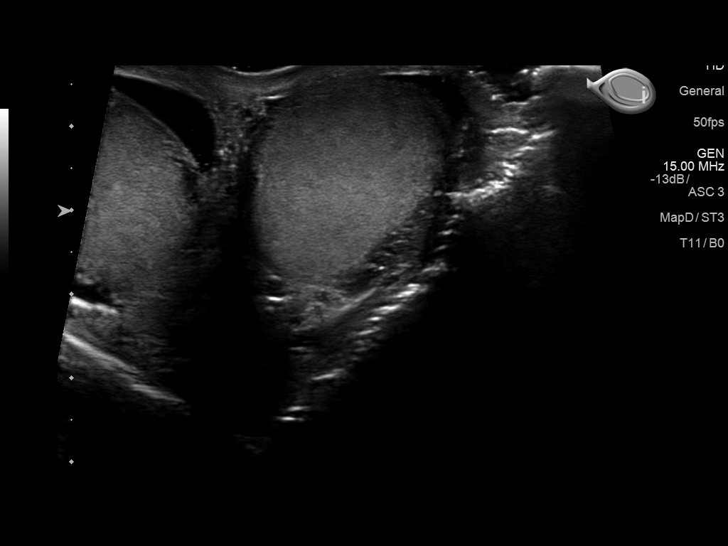
[im 47/80]
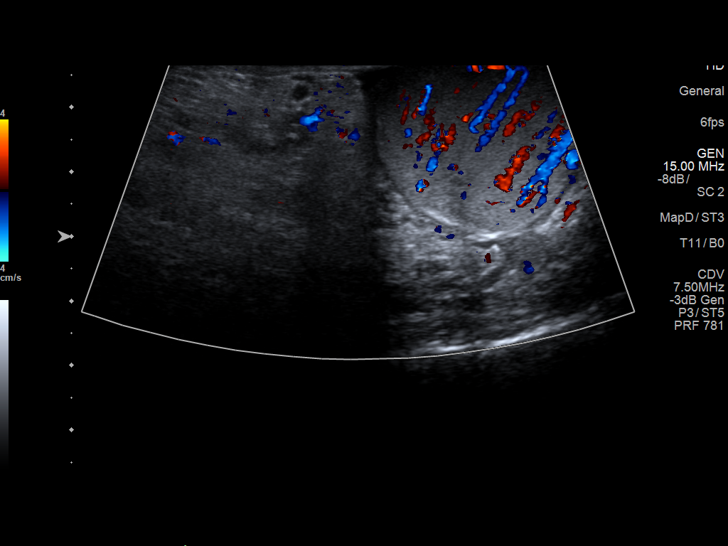
[im 53/80]
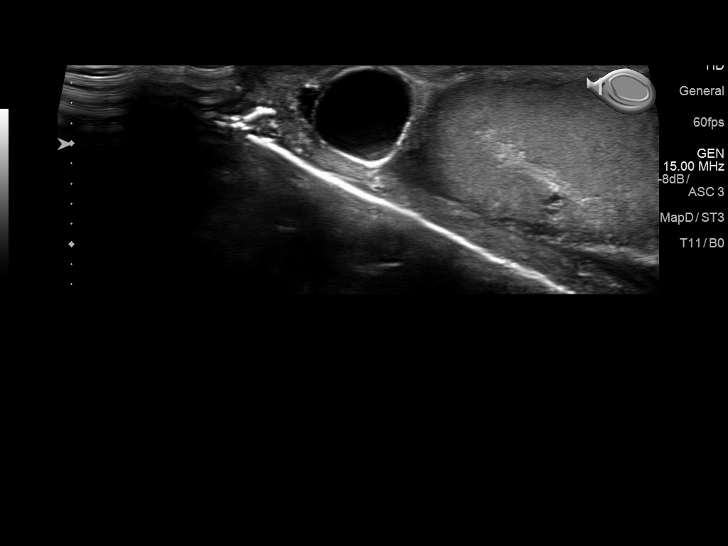
[im 60/80]
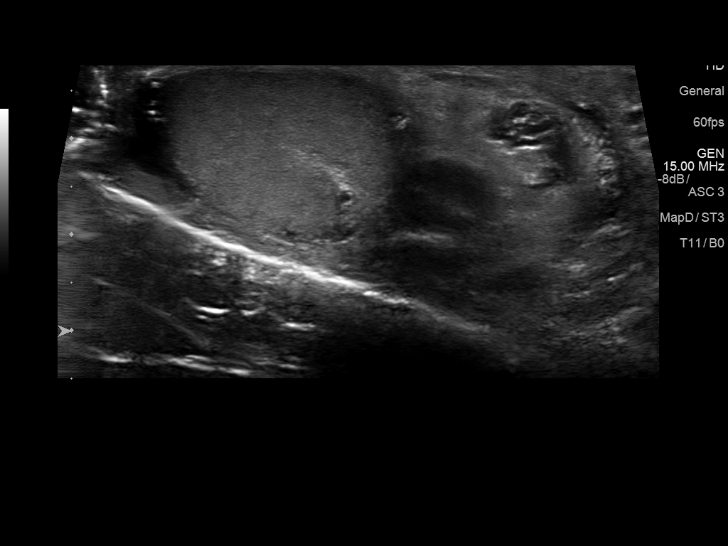
[im 66/80]
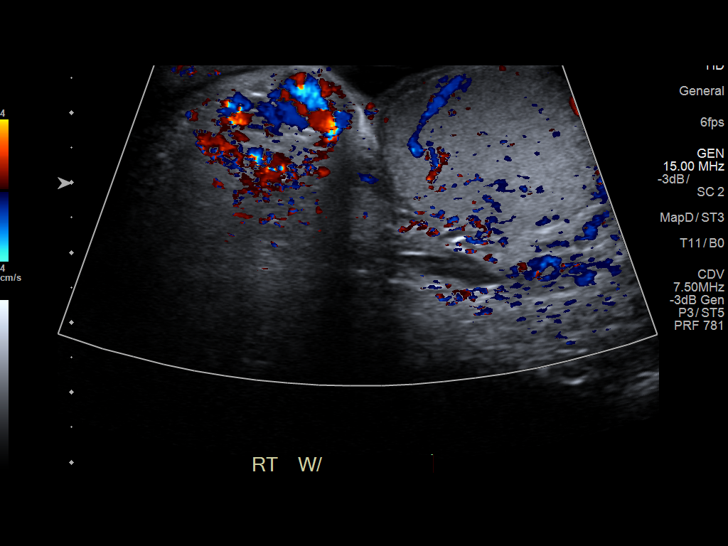
[im 73/80]
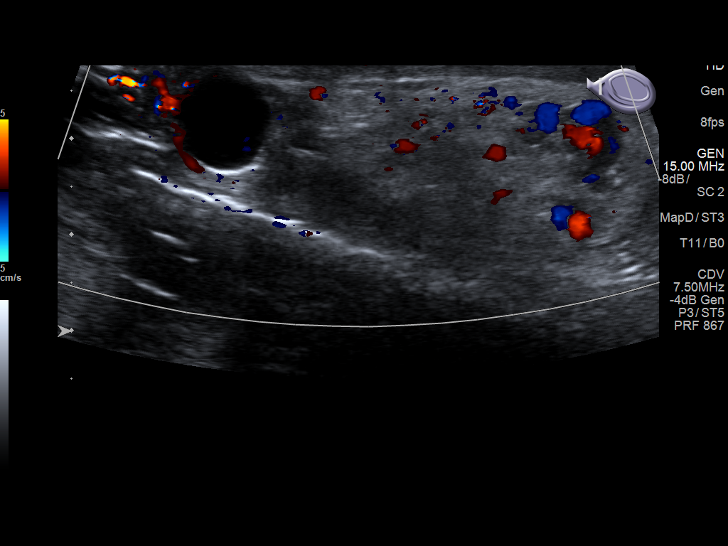
[im 80/80]
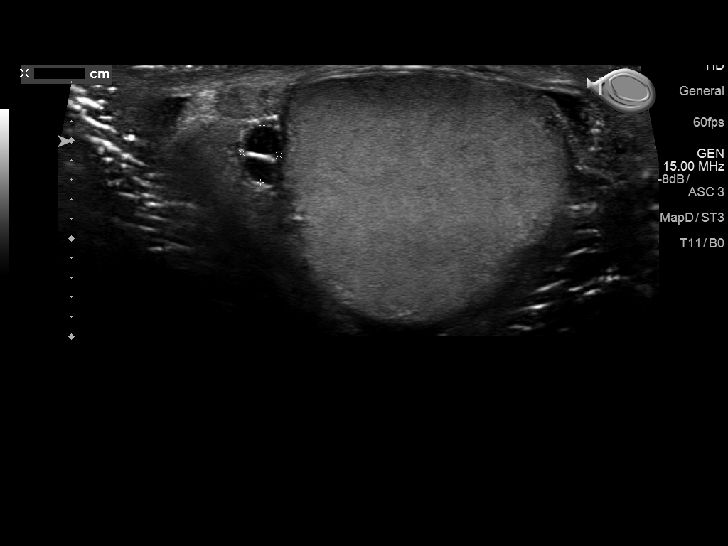

[13 of 25 positions shown; findings below may reference images not displayed]

FINDINGS: Right testicle

Measurements: 4.6 x 2.5 x 3.1 cm. No mass or microlithiasis
visualized.

Left testicle

Measurements: 4.2 x 2.7 x 3.1 cm. No mass or microlithiasis
visualized.

Right epididymis: Right epididymal cysts are noted, measuring up to
1.2 cm in size.

Left epididymis: Left epididymal cysts are noted, measuring up to
0.6 cm in size.

Hydrocele:  A small to moderate right-sided hydrocele is noted.

Varicocele:  Bilateral patent varicoceles are noted.

Pulsed Doppler interrogation of both testes demonstrates normal low
resistance arterial and venous waveforms bilaterally.
IMPRESSION: 1. No evidence of testicular torsion.
2. Bilateral patent varicoceles noted.
3. Small to moderate right-sided hydrocele noted.
4. Bilateral epididymal head cysts noted.

## 2018-10-20 ENCOUNTER — Ambulatory Visit (INDEPENDENT_AMBULATORY_CARE_PROVIDER_SITE_OTHER): Payer: BLUE CROSS/BLUE SHIELD

## 2018-10-20 ENCOUNTER — Encounter: Payer: Self-pay | Admitting: Emergency Medicine

## 2018-10-20 ENCOUNTER — Other Ambulatory Visit: Payer: Self-pay

## 2018-10-20 ENCOUNTER — Ambulatory Visit
Admission: EM | Admit: 2018-10-20 | Discharge: 2018-10-20 | Disposition: A | Discharge status: Home / Self Care | Payer: BLUE CROSS/BLUE SHIELD | Attending: Internal Medicine | Admitting: Internal Medicine

## 2018-10-20 DIAGNOSIS — R05 Cough: Secondary | ICD-10-CM

## 2018-10-20 DIAGNOSIS — R0602 Shortness of breath: Secondary | ICD-10-CM

## 2018-10-20 DIAGNOSIS — R0789 Other chest pain: Secondary | ICD-10-CM | POA: Diagnosis present

## 2018-10-20 DIAGNOSIS — R0989 Other specified symptoms and signs involving the circulatory and respiratory systems: Secondary | ICD-10-CM | POA: Diagnosis not present

## 2018-10-20 LAB — TROPONIN I: Troponin I: <0.03 ng/mL (ref <0.03)

## 2018-10-20 MED ORDER — NITROGLYCERIN 0.4 MG SL SUBL: 0.4000 mg | SUBLINGUAL_TABLET | SUBLINGUAL | Status: DC | PRN

## 2018-10-20 MED ORDER — ASPIRIN 325 MG PO TABS
325.0000 mg | ORAL_TABLET | Freq: Once | ORAL | Status: AC
  Administered 2018-10-20: 325 mg via ORAL

## 2018-10-20 NOTE — ED Triage Notes (Signed)
Pt c/o cough. He had the flu a couple of weeks ago and the cough has not gone away. He is also having shortness of breath and chest pain when coughing.

## 2018-10-20 NOTE — ED Provider Notes (Addendum)
[facility]    CSN: [id number] Arrival date & time: 10/20/18  1317     History   Chief Complaint Chief Complaint  Patient presents with  . Cough    HPI Matthew [name] is a 53 y.o. male with a history of coronary artery disease recently treated for influenza-like illness with Tamiflu comes to the care department with complaints of shortness of breath and chest tightness.  Patient said this has been lingering for the past week.  The chest tightness is persistent.  No relieving factors.  No known aggravating factors.  Patient denies any dizziness, near syncope or syncopal episodes.  No diaphoresis.  No lower extremity swelling.  No orthopnea paroxysmal nocturnal dyspnea.  Patient denies any palpitations.  He has a cough which is nonproductive.  No fever or chills.  HPI  Past Medical History:  Diagnosis Date  . Coronary artery disease   . GERD (gastroesophageal reflux disease)   . Migraines     Patient Active Problem List   Diagnosis Date Noted  . Bilateral recurrent inguinal hernia without obstruction or gangrene   . Bad memory 12/12/2012  . Speech and language deficits 12/12/2012    Past Surgical History:  Procedure Laterality Date  . BILATERAL CARPAL TUNNEL RELEASE    . CARDIAC CATHETERIZATION    . COLONOSCOPY WITH PROPOFOL N/A 02/28/2015   Procedure: COLONOSCOPY WITH PROPOFOL;  Surgeon: Josefine Class, MD;  Location: [facility] ENDOSCOPY;  Service: Endoscopy;  Laterality: N/A;  . HERNIA REPAIR    . INGUINAL HERNIA REPAIR Bilateral 12/29/2015   Procedure: LAPAROSCOPIC INGUINAL HERNIA;  Surgeon: Jules Husbands, MD;  Location: [facility] ORS;  Service: General;  Laterality: Bilateral;  . left hand surgery    . LOOP RECORDER INSERTION N/A 06/19/2017   Procedure: LOOP RECORDER INSERTION;  Surgeon: Isaias Cowman, MD;  Location: [place] [facility] LAB;  Service: Cardiovascular;  Laterality: N/A;       Home Medications    Prior to Admission medications     Medication Sig Start Date End Date Taking? Authorizing Provider  aspirin EC 81 MG tablet Take 81 mg by mouth daily.   Yes [provider]  ibuprofen (ADVIL,MOTRIN) 200 MG tablet Take 400 mg by mouth daily as needed.   Yes [provider]  ranitidine (ZANTAC) 150 MG capsule Take 150 mg by mouth daily.   Yes [provider]  oseltamivir (TAMIFLU) 75 MG capsule Take 1 capsule (75 mg total) by mouth every 12 (twelve) hours. 09/20/17   Coral Spikes, DO  topiramate (TOPAMAX) 25 MG tablet Take 25 mg by mouth daily.    [provider]    Family History Family History  Adopted: Yes  Problem Relation Age of Onset  . COPD Father     Social History Social History   Tobacco Use  . Smoking status: Former Smoker    Last attempt to quit: 12/14/1998    Years since quitting: 19.8  . Smokeless tobacco: Never Used  Substance Use Topics  . Alcohol use: No    Alcohol/week: 0.0 standard drinks  . Drug use: No     Allergies   Patient has no known allergies.   Review of Systems Review of Systems  Constitutional: Negative for activity change, appetite change, fatigue and fever.  HENT: Positive for congestion. Negative for drooling, ear discharge, rhinorrhea, sinus pressure, sinus pain, sore throat and voice change.   Eyes: Negative for pain, discharge and itching.  Respiratory: Positive for cough, chest  tightness and shortness of breath. Negative for wheezing.   Cardiovascular: Negative for chest pain, palpitations and leg swelling.  Gastrointestinal: Negative for abdominal distention, abdominal pain, constipation, diarrhea and nausea.  Endocrine: Negative for cold intolerance and heat intolerance.  Genitourinary: Negative for dysuria, frequency and urgency.  Musculoskeletal: Negative for arthralgias, gait problem and myalgias.  Allergic/Immunologic: Negative for environmental allergies.  Neurological: Negative for dizziness, weakness, light-headedness,  numbness and headaches.  Hematological: Negative for adenopathy.  Psychiatric/Behavioral: Negative for agitation, confusion and decreased concentration.     Physical Exam Triage Vital Signs ED Triage Vitals  Enc Vitals Group     BP 10/20/18 1352 (!) 127/93     Pulse Rate 10/20/18 1352 80     Resp 10/20/18 1352 20     Temp 10/20/18 1352 98 F (36.7 C)     Temp Source 10/20/18 1352 Oral     SpO2 10/20/18 1352 99 %     Weight 10/20/18 1348 220 lb (99.8 kg)     Height 10/20/18 1348 5\' 11"  (1.803 m)     Head Circumference --      Peak Flow --      Pain Score 10/20/18 1348 4     Pain Loc --      Pain Edu? --      Excl. in [place]? --    No data found.  Updated Vital Signs BP (!) 127/93 (BP Location: Left Arm)   Pulse 80   Temp 98 F (36.7 C) (Oral)   Resp 20   Ht 5\' 11"  (1.803 m)   Wt 99.8 kg   SpO2 99%   BMI 30.68 kg/m   Visual Acuity Right Eye Distance:   Left Eye Distance:   Bilateral Distance:    Right Eye Near:   Left Eye Near:    Bilateral Near:     Physical Exam Constitutional:      General: He is in acute distress.     Appearance: Normal appearance. He is not ill-appearing.  HENT:     Right Ear: Tympanic membrane normal.     Left Ear: Tympanic membrane normal.     Nose: Nose normal. No congestion.     Mouth/Throat:     Mouth: Mucous membranes are moist.     Pharynx: No oropharyngeal exudate or posterior oropharyngeal erythema.  Eyes:     Extraocular Movements: Extraocular movements intact.     Pupils: Pupils are equal, round, and reactive to light.  Neck:     Musculoskeletal: Normal range of motion.  Cardiovascular:     Rate and Rhythm: Normal rate and regular rhythm.     Pulses: Normal pulses.     Heart sounds: Normal heart sounds. No murmur. No gallop.   Pulmonary:     Effort: Pulmonary effort is normal.     Breath sounds: Normal breath sounds. No wheezing or rales.  Abdominal:     General: Abdomen is flat.     Palpations: Abdomen is soft.   Musculoskeletal: Normal range of motion.        General: No swelling or deformity.     Right lower leg: No edema.     Left lower leg: No edema.  Skin:    General: Skin is warm and dry.     Capillary Refill: Capillary refill takes less than 2 seconds.     Coloration: Skin is not pale.     Findings: No bruising or erythema.  Neurological:     General: No focal  deficit present.     Mental Status: He is alert and oriented to person, place, and time.     Cranial Nerves: No cranial nerve deficit.     Sensory: No sensory deficit.  Psychiatric:        Behavior: Behavior normal.      UC Treatments / Results  Labs (all labs ordered are listed, but only abnormal results are displayed) Labs Reviewed - No data to display  EKG None  Radiology No results found.  Procedures Procedures (including critical care time)  Medications Ordered in UC Medications - No data to display  Initial Impression / Assessment and Plan / UC Course  I have reviewed the triage vital signs and the nursing notes.  Pertinent labs & imaging results that were available during my care of the patient were reviewed by me and considered in my medical decision making (see chart for details).     1.  Chest pressure; EKG stat Chest x-ray stat Troponin stat  2.  History of cardiac catheterization in 2007:  3.  History of recurrent syncope Status post loop recorder  4.  Hyperlipidemia  If chest x-ray does not explain shortness of breath, patient will be advised to go to the emergency department for chest pain rule out and further work-up.  Addendum: X-ray was negative for acute lung infiltrate.  EKG showed normal sinus rhythm.  Troponins are pending.  Patient continues to have some chest pressure.  I counseled the patient extensively regarding going to the emergency department for further work-up.  Patient does not want to go to the emergency department.  He prefers to go to the primary cardiologist's office  in the morning for further evaluation.  I called the primary cardiologist Dr. Kandis Cocking face and he has an appointment for 10:45 AM tomorrow.  Final Clinical Impressions(s) / UC Diagnoses   Final diagnoses:  None   Discharge Instructions   None    ED Prescriptions    None     Controlled Substance Prescriptions [place] Controlled Substance Registry consulted? No   Chase Picket, MD 10/20/18 1434    Chase Picket, MD 10/20/18 [phone]

## 2019-04-17 DIAGNOSIS — R079 Chest pain, unspecified: Secondary | ICD-10-CM | POA: Insufficient documentation

## 2019-04-24 ENCOUNTER — Other Ambulatory Visit: Payer: Self-pay

## 2019-04-24 DIAGNOSIS — Z20822 Contact with and (suspected) exposure to covid-19: Secondary | ICD-10-CM

## 2019-04-25 LAB — NOVEL CORONAVIRUS, NAA: SARS-CoV-2, NAA: NOT DETECTED

## 2019-06-30 ENCOUNTER — Other Ambulatory Visit: Payer: Self-pay

## 2019-06-30 DIAGNOSIS — Z20822 Contact with and (suspected) exposure to covid-19: Secondary | ICD-10-CM

## 2019-07-01 LAB — NOVEL CORONAVIRUS, NAA: SARS-CoV-2, NAA: DETECTED — AB

## 2020-06-27 NOTE — Progress Notes (Signed)
 Established Patient Visit   Chief Complaint: Chief Complaint  Patient presents with  . Syncope    2020   Date of Service: 06/27/2020 Date of Birth: 1966-05-30 PCP: Buren Rock Browning, MD  History of Present Illness: Mr. Streety is a 54 y.o.male patient who returns for   1.  Recurrent syncope  2.  History of CAD, status post cardiac catheterization 2003 and 2005  3.  Hyperlipidemia  4.  Questionable history of prior stroke 2013  5.  Obstructive sleep apnea  The patient was seen on 05/16/2017 following syncope on 03/29/2017, and recurrent syncope 05/07/2017.  Neurological workup including MRI of the brain was unremarkable.  24-hour Holter monitor revealed predominant normal sinus rhythm with a mean heart rate of 80 bpm, infrequent premature ventricular contractions and premature atrial contractions, a 5 beat atrial run, and a 3 beat ventricular run.  Stress echocardiogram was performed 06/04/2017.  The patient exercised 12 minutes on a Bruce protocol without chest pain or ECG changes.  At baseline 2D echocardiogram revealed normal left ventricular function with LV ejection fraction greater than 55%.  At peak exercise there was appropriate augmentation of all myocardial segments without evidence for scar or ischemia. The patient underwent successful Linq implantation on 06/19/2017.  Sleep study 06/17/2017 revealed findings consistent with moderate to severe obstructive sleep apnea.  The patient returns today, reports doing good.  He denies chest pain.  He does have residual chronic exertional dyspnea following Covid pneumonia 06/2019.  He denies palpitations or heart racing.  He denies peripheral edema.  The patient is active but does not exercise regularly.  He underwent ETT Myoview 06/17/2019, exercised 12 minutes on a Bruce protocol without chest pain or ischemic ECG changes.  Gated scintigraphy revealed LV ejection fraction of 59%.  SPECT analysis did not reveal evidence for scar or  ischemia.  The patient has hyperlipidemia, LDL 122 on 08/31/2019, on atorvastatin, which is well-tolerated without apparent side effects, followed by his primary care provider.  The patient follows a low-cholesterol, low-fat diet.  Past Medical and Surgical History  Past Medical History Past Medical History:  Diagnosis Date  . CAD (coronary artery disease)   . External hemorrhoids 02/28/2015  . GERD (gastroesophageal reflux disease) 08/2008  . Hyperlipidemia   . Hypertension 03/2017  . Internal hemorrhoids 02/28/2015  . Sleep apnea, obstructive 06/26/2017  . Stroke (CMS-HCC)   . Syncope and collapse 03/2017  . Tubular adenoma of colon, unspecified 02/28/2015    Past Surgical History He has a past surgical history that includes Colonoscopy (02/28/2015); Inguinal hernia repair; Cardiac catheterization (08/2003); and linq implant (2019).   Medications and Allergies  Current Medications  Current Outpatient Medications  Medication Sig Dispense Refill  . aspirin  81 MG chewable tablet Take 81 mg by mouth daily.    . aspirin /acetaminophen /caffeine (EXCEDRIN MIGRAINE ORAL) Take by mouth.    SABRA atorvastatin (LIPITOR) 40 MG tablet Take 40 mg by mouth once daily    . budesonide-glycopyr-formoterol (BREZTRI AEROSPHERE) 160-9-4.8 mcg/actuation HFAA Inhale 2 inhalations into the lungs 2 (two) times daily 5.9 g 11  . fluticasone (FLONASE) 50 mcg/actuation nasal spray Place 2 sprays into both nostrils once daily    . lansoprazole (PREVACID) 15 MG DR capsule Take by mouth.    . naproxen  (NAPROSYN ) 500 MG tablet Take by mouth.    . isosorbide mononitrate (IMDUR) 30 MG ER tablet Take 1 tablet (30 mg total) by mouth once daily 30 tablet 11  . metoprolol succinate (TOPROL-XL) 25 MG XL  tablet Take 1 tablet (25 mg total) by mouth once daily 30 tablet 11  . topiramate (TOPAMAX) 50 MG tablet Take 1 tablet (50 mg total) by mouth nightly (Patient taking differently: Take 50 mg by mouth 2 (two) times daily  ) 90 tablet  3   No current facility-administered medications for this visit.    Allergies: Patient has no known allergies.  Social and Family History  Social History  reports that he quit smoking about 8 years ago. His smoking use included cigarettes. He started smoking about 40 years ago. He has a 25.00 pack-year smoking history. He quit smokeless tobacco use about 15 years ago. He reports that he does not drink alcohol and does not use drugs.  Family History Family History  Problem Relation Age of Onset  . Myocardial Infarction (Heart attack) Brother   . Coronary Artery Disease (Blocked arteries around heart) Brother   . COPD Brother   . Sleep disorder Paternal Aunt     Review of Systems   Review of Systems: The patient reports chest pain with coughing, without shortness of breath, orthopnea, paroxysmal nocturnal dyspnea, pedal edema, with occasioanl palpitations, without heart racing, presyncope, syncope. Review of 12 Systems is negative except as described above.  Physical Examination   Vitals:Pulse 82   Ht 180.3 cm (5' 11)   Wt (!) 101.6 kg (224 lb)   SpO2 95%   BMI 31.24 kg/m  Ht:180.3 cm (5' 11) Wt:(!) 101.6 kg (224 lb) ADJ:Anib surface area is 2.26 meters squared. Body mass index is 31.24 kg/m.  General: Alert and oriented. Well-appearing. No acute distress. HEENT: Pupils equally reactive to light and accomodation    Neck: Supple, no JVD Lungs: Normal effort of breathing; clear to auscultation bilaterally; no wheezes, rales, rhonchi Heart: Regular rate and rhythm. No murmur, rub, or gallop Abdomen: nondistended, with normal bowel sounds Extremities: no edema Peripheral Pulses: 2+ radial  Skin: Warm, dry, no diaphoresis  Assessment   54 y.o. male with  1. Coronary artery disease involving native coronary artery of native heart with angina pectoris (CMS-HCC)   2. Need for vaccination   3. Sleep apnea, obstructive   4. Chest pain with high risk of acute coronary  syndrome    54 year old gentleman with undocumented history of coronary artery disease, questionable history of prior stroke, with 2 recent syncopal episodes, with unremarkable MRI of the brain, unremarkable 24-hour Holter monitor, and normal stress echocardiogram.  The patient underwent successful implantation of Linq monitor device.  Home sleep study was consistent with moderate to severe obstructive sleep apnea with history of generalized fatigue and excessive daytime sleepiness. The patient reports improvement in his symptoms overall after continued use of his CPAP. He denies recurrence of presyncope or syncope.  Linq interrogation has not revealed bradycardia, tachycardia, or atrial fibrillation episodes.  ETT Myoview 06/17/2019 revealed normal left ventricular function, without evidence for scar or ischemia.  Plan   1.  Continue current medications 2.  Continue regular CPAP use 3.  Continue to monitor Linq device 4.  Counseled patient about low-cholesterol diet 5.  Low-fat and cholesterol diet printed instructions given to the patient 6.  Return to clinic for follow-up in 6  No orders of the defined types were placed in this encounter.   Return in about 6 months (around 12/25/2020).  MARSA DOOMS, MD PhD [facility]

## 2020-10-05 IMAGING — CR DG CHEST 2V
2 series · 2 of 2 positions shown · non-contrast
Comparison: Radiographs 06/18/2012 and 09/26/2010.

CLINICAL DATA: Upper chest pain/tightness and slightly productive
cough. Flu symptoms 2 weeks ago.

EXAM:
CHEST - 2 VIEW

[chest pa]
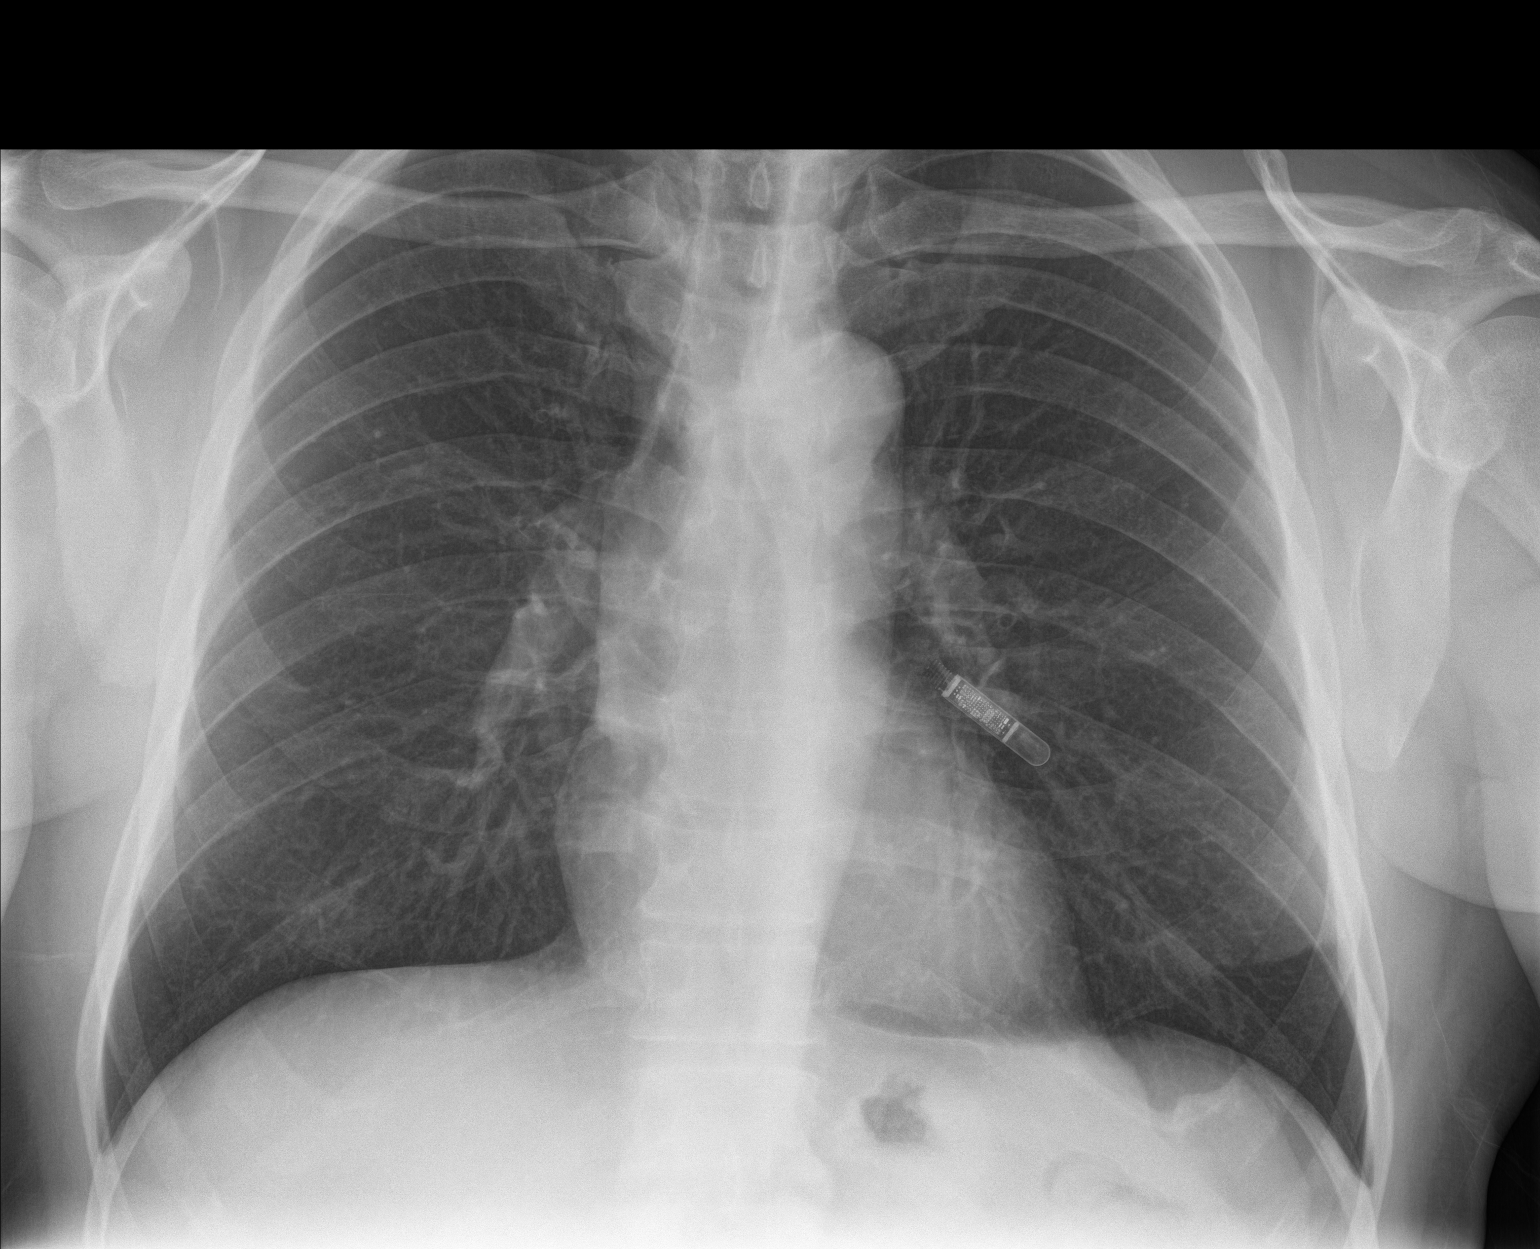

[chest lat]
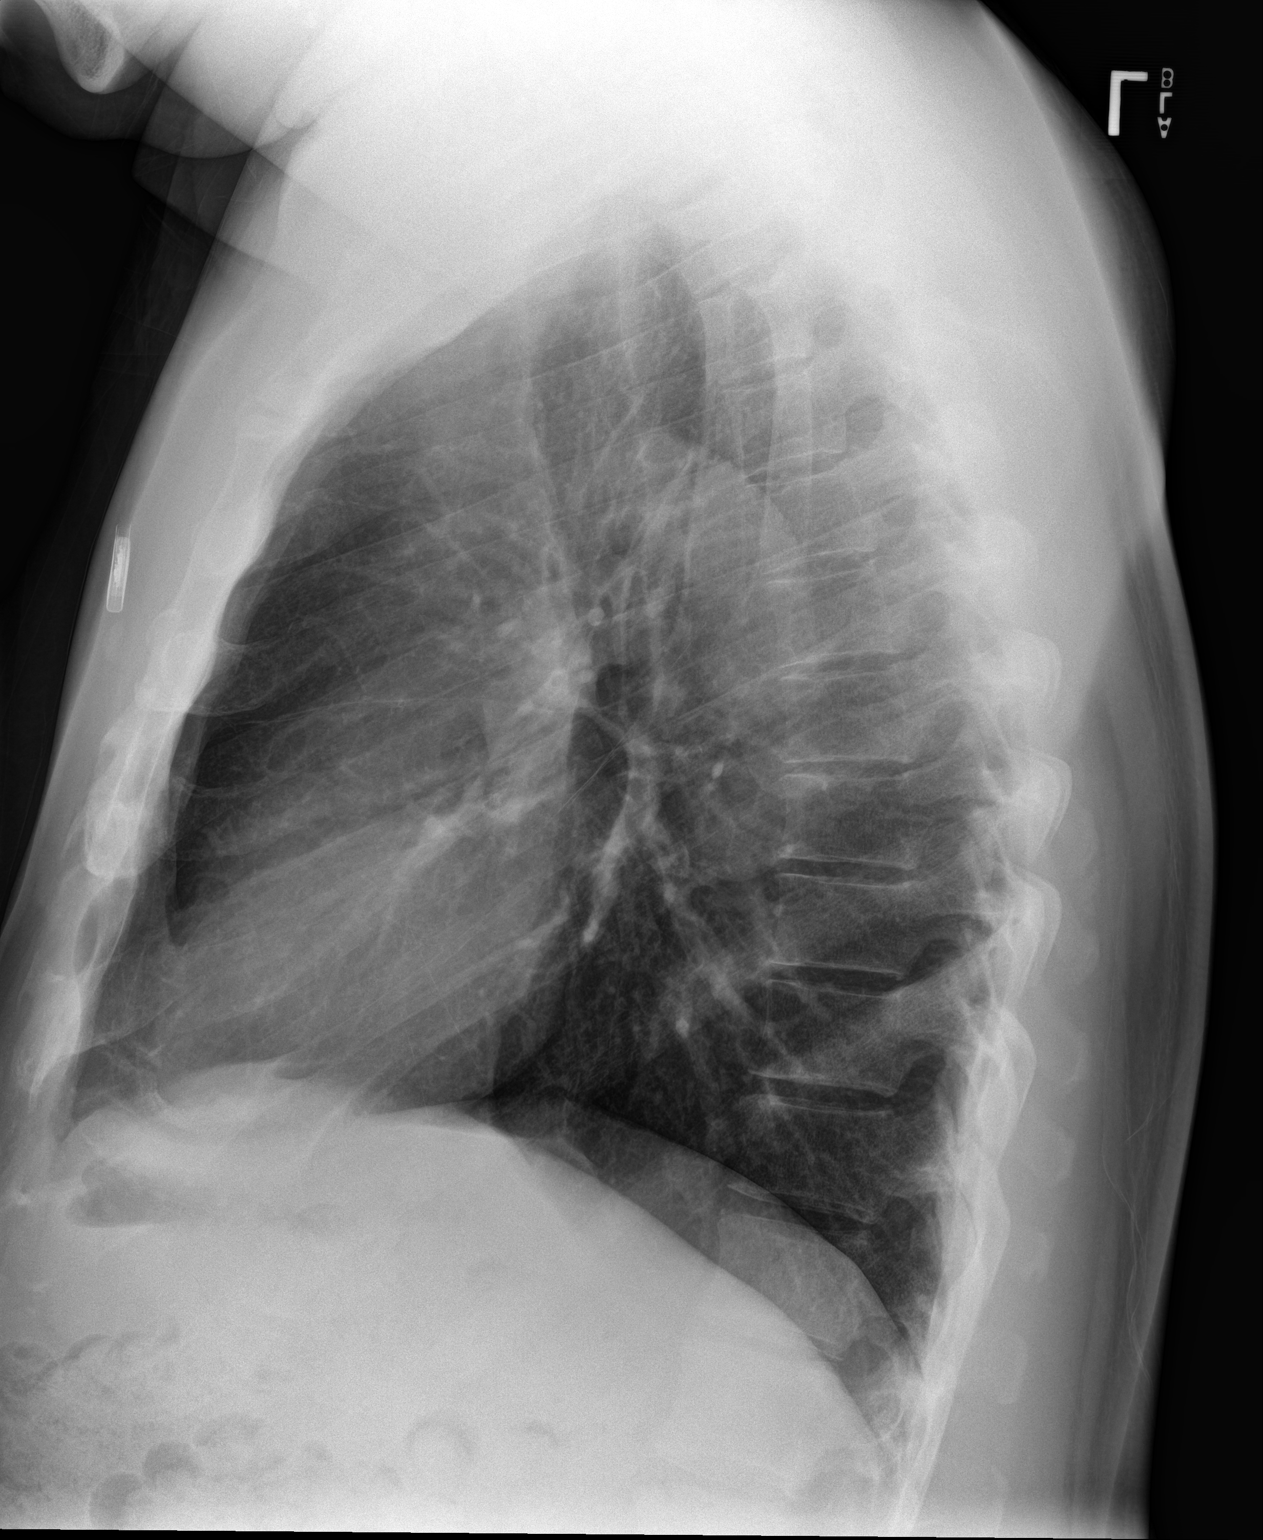

[2 of 2 positions shown; findings below may reference images not displayed]

FINDINGS: The heart size and mediastinal contours are normal. The lungs are
clear. There is no pleural effusion or pneumothorax. No acute
osseous findings are identified. Loop recorder overlies the left
anterior chest wall.
IMPRESSION: No active cardiopulmonary process.

## 2021-03-14 DIAGNOSIS — R1319 Other dysphagia: Secondary | ICD-10-CM | POA: Insufficient documentation

## 2021-03-14 DIAGNOSIS — D126 Benign neoplasm of colon, unspecified: Secondary | ICD-10-CM | POA: Insufficient documentation

## 2021-03-20 ENCOUNTER — Ambulatory Visit: Payer: 59 | Admitting: Podiatry

## 2021-03-22 ENCOUNTER — Ambulatory Visit (INDEPENDENT_AMBULATORY_CARE_PROVIDER_SITE_OTHER): Payer: Managed Care, Other (non HMO)

## 2021-03-22 ENCOUNTER — Other Ambulatory Visit: Payer: Self-pay

## 2021-03-22 ENCOUNTER — Encounter: Payer: Self-pay | Admitting: Podiatry

## 2021-03-22 ENCOUNTER — Ambulatory Visit: Payer: Self-pay | Admitting: Podiatry

## 2021-03-22 DIAGNOSIS — G5791 Unspecified mononeuropathy of right lower limb: Secondary | ICD-10-CM | POA: Diagnosis not present

## 2021-03-22 DIAGNOSIS — G5790 Unspecified mononeuropathy of unspecified lower limb: Secondary | ICD-10-CM

## 2021-03-22 DIAGNOSIS — G5792 Unspecified mononeuropathy of left lower limb: Secondary | ICD-10-CM

## 2021-03-22 MED ORDER — TRIAMCINOLONE ACETONIDE 40 MG/ML IJ SUSP
40.0000 mg | Freq: Once | INTRAMUSCULAR | Status: AC
  Administered 2021-03-22: 40 mg

## 2021-03-22 NOTE — Progress Notes (Signed)
Subjective:  Patient ID: Matthew [name], male    DOB: 04-01-1966,  MRN: [id number] HPI Chief Complaint  Patient presents with   Foot Pain    Plantar forefoot bilateral - burning x 3 months, "pre-diabetic", started in right, tried new shoes with thicker soles, by end of the day the whole foot burns   New Patient (Initial Visit)    55 y.o. male presents with the above complaint.   ROS: Denies fever chills nausea vomiting muscle aches pains calf pain back pain chest pain shortness of breath.  Past Medical History:  Diagnosis Date   Coronary artery disease    GERD (gastroesophageal reflux disease)    Migraines    Past Surgical History:  Procedure Laterality Date   BILATERAL CARPAL TUNNEL RELEASE     CARDIAC CATHETERIZATION     COLONOSCOPY WITH PROPOFOL N/A 02/28/2015   Procedure: COLONOSCOPY WITH PROPOFOL;  Surgeon: Matthew Class, MD;  Location: [facility] END[facility]COPY;  Service: Endoscopy;  Laterality: N/A;   HERNIA REPAIR     INGUINAL HERNIA REPAIR Bilateral 12/29/2015   Procedure: LAPAROSCOPIC INGUINAL HERNIA;  Surgeon: Matthew Husbands, MD;  Location: [facility] ORS;  Service: General;  Laterality: Bilateral;   left hand surgery     LOOP RECORDER INSERTION N/A 06/19/2017   Procedure: LOOP RECORDER INSERTION;  Surgeon: Matthew Cowman, MD;  Location: [place] [facility];  Service: Cardiovascular;  Laterality: N/A;    Current Outpatient Medications:    aspirin EC 81 MG tablet, Take 81 mg by mouth daily., Disp: , Rfl:    atorvastatin (LIPITOR) 40 MG tablet, Take 40 mg by mouth daily as needed., Disp: , Rfl:    isosorbide mononitrate (IMDUR) 30 MG 24 hr tablet, Take 30 mg by mouth daily., Disp: , Rfl:    metoprolol succinate (TOPROL-XL) 25 MG 24 hr tablet, Take 25 mg by mouth daily., Disp: , Rfl:    montelukast (SINGULAIR) 10 MG tablet, Take 10 mg by mouth daily., Disp: , Rfl:   No Known Allergies Review of Systems Objective:  There were no vitals filed for this  visit.  General: Well developed, nourished, in no acute distress, alert and oriented x3   Dermatological: Skin is warm, dry and supple bilateral. Nails x 10 are well maintained; remaining integument appears unremarkable at this time. There are no open sores, no preulcerative lesions, no rash or signs of infection present.  Vascular: Dorsalis Pedis artery and Posterior Tibial artery pedal pulses are 2/4 bilateral with immedate capillary fill time. Pedal hair growth present. No varicosities and no lower extremity edema present bilateral.   Neruologic: Grossly intact via light touch bilateral. Vibratory intact via tuning fork bilateral. Protective threshold with Semmes Wienstein monofilament intact to all pedal sites bilateral. Patellar and Achilles deep tendon reflexes 2+ bilateral. No Babinski or clonus noted bilateral.   Musculoskeletal: No gross boney pedal deformities bilateral. No pain, crepitus, or limitation noted with foot and ankle range of motion bilateral. Muscular strength 5/5 in all groups tested bilateral.  Pain on palpation of the second metatarsal phalangeal joint and third interdigital space consistent with capsulitis and neuroma.  Gait: Unassisted, Nonantalgic.    Radiographs:  Radiographs do not demonstrate any type of osseous abnormalities other than an elongated second metatarsal.  Assessment & Plan:   Assessment: Neuroma and capsulitis third intermetatarsal space and second metatarsal phalangeal joint.  Bilateral  Plan: Injected Kenalog to the third interdigital space today discussed appropriate shoe gear stretching exercise ice therapy sugar modifications.  Follow-up  with him as needed.     Matthew [name]. [place], [place]

## 2021-04-19 ENCOUNTER — Ambulatory Visit: Payer: Managed Care, Other (non HMO) | Admitting: Podiatry

## 2022-05-17 ENCOUNTER — Telehealth: Payer: Self-pay

## 2022-05-17 NOTE — Telephone Encounter (Signed)
Patient is established with [facility] GI last office visit was with Dr. Alice Reichert 03/14/21.  Informed him that since he is established with [facility] unless he is transferring care he will need to have his colonoscopy with them.  I contacted Dr.Toledo's office to make them aware that patient would like to schedule his colonoscopy.  The receptionist said they sent him a letter in August, and will call him.  Thanks,  [place], [place]

## 2022-12-21 ENCOUNTER — Other Ambulatory Visit: Payer: Self-pay | Admitting: Orthopedic Surgery

## 2022-12-21 DIAGNOSIS — M25561 Pain in right knee: Secondary | ICD-10-CM

## 2022-12-26 ENCOUNTER — Encounter: Payer: Self-pay | Admitting: Orthopedic Surgery

## 2023-01-01 ENCOUNTER — Other Ambulatory Visit: Payer: Self-pay | Admitting: Orthopedic Surgery

## 2023-01-01 ENCOUNTER — Ambulatory Visit
Admission: RE | Admit: 2023-01-01 | Discharge: 2023-01-01 | Disposition: A | Discharge status: Home / Self Care | Payer: Managed Care, Other (non HMO) | Source: Ambulatory Visit | Attending: Orthopedic Surgery | Admitting: Orthopedic Surgery

## 2023-01-01 DIAGNOSIS — M25561 Pain in right knee: Secondary | ICD-10-CM

## 2023-01-08 ENCOUNTER — Other Ambulatory Visit: Payer: Self-pay | Admitting: Orthopedic Surgery

## 2023-01-28 ENCOUNTER — Encounter: Payer: Self-pay | Admitting: Orthopedic Surgery

## 2023-01-28 NOTE — Anesthesia Preprocedure Evaluation (Addendum)
Anesthesia Evaluation  Patient identified by MRN, date of birth, ID band Patient awake    Reviewed: Allergy & Precautions, H&P , NPO status , Patient's Chart, lab work & pertinent test results  Airway Mallampati: III  TM Distance: >3 FB Neck ROM: Full    Dental no notable dental hx. (+) Partial Upper   Pulmonary sleep apnea and Continuous Positive Airway Pressure Ventilation , former smoker Severe sleep apnea   Pulmonary exam normal breath sounds clear to auscultation       Cardiovascular hypertension, + CAD  Normal cardiovascular exam Rhythm:Regular Rate:Normal     Neuro/Psych  Headaches negative neurological ROS  negative psych ROS   GI/Hepatic negative GI ROS, Neg liver ROS,GERD  ,,  Endo/Other  negative endocrine ROS    Renal/GU negative Renal ROS  negative genitourinary   Musculoskeletal negative musculoskeletal ROS (+)    Abdominal   Peds negative pediatric ROS (+)  Hematology negative hematology ROS (+)   Anesthesia Other Findings Coronary artery disease  GERD (gastroesophageal reflux disease) Migraines  Hypertension    Reproductive/Obstetrics negative OB ROS                             Anesthesia Physical Anesthesia Plan  ASA: 3  Anesthesia Plan:    Post-op Pain Management:    Induction: Intravenous  PONV Risk Score and Plan:   Airway Management Planned: LMA  Additional Equipment:   Intra-op Plan:   Post-operative Plan: Extubation in OR  Informed Consent: I have reviewed the patients History and Physical, chart, labs and discussed the procedure including the risks, benefits and alternatives for the proposed anesthesia with the patient or authorized representative who has indicated his/her understanding and acceptance.     Dental Advisory Given  Plan Discussed with: Anesthesiologist, CRNA and Surgeon  Anesthesia Plan Comments: (Patient consented for risks  of anesthesia including but not limited to:  - adverse reactions to medications - damage to eyes, teeth, lips or other oral mucosa - nerve damage due to positioning  - sore throat or hoarseness - Damage to heart, brain, nerves, lungs, other parts of body or loss of life  Patient voiced understanding.)       Anesthesia Quick Evaluation

## 2023-02-01 ENCOUNTER — Other Ambulatory Visit: Payer: Self-pay

## 2023-02-01 ENCOUNTER — Encounter
Admission: RE | Disposition: A | Discharge status: Home / Self Care | Payer: Self-pay | Source: Home / Self Care | Attending: Orthopedic Surgery

## 2023-02-01 ENCOUNTER — Ambulatory Visit: Payer: Managed Care, Other (non HMO) | Admitting: Anesthesiology

## 2023-02-01 ENCOUNTER — Encounter: Payer: Self-pay | Admitting: Orthopedic Surgery

## 2023-02-01 ENCOUNTER — Ambulatory Visit
Admission: RE | Admit: 2023-02-01 | Discharge: 2023-02-01 | Disposition: A | Discharge status: Home / Self Care | Payer: Managed Care, Other (non HMO) | Attending: Orthopedic Surgery | Admitting: Orthopedic Surgery

## 2023-02-01 DIAGNOSIS — G43909 Migraine, unspecified, not intractable, without status migrainosus: Secondary | ICD-10-CM | POA: Insufficient documentation

## 2023-02-01 DIAGNOSIS — I2510 Atherosclerotic heart disease of native coronary artery without angina pectoris: Secondary | ICD-10-CM | POA: Diagnosis not present

## 2023-02-01 DIAGNOSIS — G4733 Obstructive sleep apnea (adult) (pediatric): Secondary | ICD-10-CM | POA: Diagnosis not present

## 2023-02-01 DIAGNOSIS — X58XXXA Exposure to other specified factors, initial encounter: Secondary | ICD-10-CM | POA: Insufficient documentation

## 2023-02-01 DIAGNOSIS — I10 Essential (primary) hypertension: Secondary | ICD-10-CM | POA: Insufficient documentation

## 2023-02-01 DIAGNOSIS — K219 Gastro-esophageal reflux disease without esophagitis: Secondary | ICD-10-CM | POA: Insufficient documentation

## 2023-02-01 DIAGNOSIS — Z87891 Personal history of nicotine dependence: Secondary | ICD-10-CM | POA: Diagnosis not present

## 2023-02-01 DIAGNOSIS — S83232A Complex tear of medial meniscus, current injury, left knee, initial encounter: Secondary | ICD-10-CM | POA: Diagnosis present

## 2023-02-01 HISTORY — DX: Obstructive sleep apnea (adult) (pediatric): G47.33

## 2023-02-01 HISTORY — DX: Essential (primary) hypertension: I10

## 2023-02-01 HISTORY — PX: KNEE ARTHROSCOPY WITH MEDIAL MENISECTOMY: SHX5651

## 2023-02-01 SURGERY — ARTHROSCOPY, KNEE, WITH MEDIAL MENISCECTOMY
Anesthesia: General | Site: Knee | Laterality: Left

## 2023-02-01 MED ORDER — LACTATED RINGERS IV SOLN: INTRAVENOUS | Status: DC

## 2023-02-01 MED ORDER — MIDAZOLAM HCL 5 MG/5ML IJ SOLN
INTRAMUSCULAR | Status: DC | PRN
  Administered 2023-02-01: 2 mg via INTRAVENOUS

## 2023-02-01 MED ORDER — LACTATED RINGERS IR SOLN
Status: DC | PRN
  Administered 2023-02-01: 6000 mL

## 2023-02-01 MED ORDER — LIDOCAINE-EPINEPHRINE 1 %-1:100000 IJ SOLN
INTRAMUSCULAR | Status: DC | PRN
  Administered 2023-02-01: 2 mL via INTRAMUSCULAR

## 2023-02-01 MED ORDER — EPHEDRINE SULFATE (PRESSORS) 50 MG/ML IJ SOLN
INTRAMUSCULAR | Status: DC | PRN
  Administered 2023-02-01: 5 mg via INTRAVENOUS

## 2023-02-01 MED ORDER — ASPIRIN 325 MG PO TBEC: 325.0000 mg | DELAYED_RELEASE_TABLET | Freq: Every day | ORAL | 0 refills | Status: AC

## 2023-02-01 MED ORDER — ONDANSETRON 4 MG PO TBDP: 4.0000 mg | ORAL_TABLET | Freq: Three times a day (TID) | ORAL | 0 refills | Status: DC | PRN

## 2023-02-01 MED ORDER — IBUPROFEN 800 MG PO TABS: 800.0000 mg | ORAL_TABLET | Freq: Three times a day (TID) | ORAL | 0 refills | Status: AC

## 2023-02-01 MED ORDER — ONDANSETRON HCL 4 MG/2ML IJ SOLN
INTRAMUSCULAR | Status: DC | PRN
  Administered 2023-02-01: 4 mg via INTRAVENOUS

## 2023-02-01 MED ORDER — KETOROLAC TROMETHAMINE 15 MG/ML IJ SOLN
INTRAMUSCULAR | Status: DC | PRN
  Administered 2023-02-01: 15 mg via INTRAVENOUS

## 2023-02-01 MED ORDER — ACETAMINOPHEN 500 MG PO TABS: 1000.0000 mg | ORAL_TABLET | Freq: Three times a day (TID) | ORAL | 2 refills | Status: AC

## 2023-02-01 MED ORDER — GLYCOPYRROLATE 0.2 MG/ML IJ SOLN
INTRAMUSCULAR | Status: DC | PRN
  Administered 2023-02-01 (×2): .2 mg via INTRAVENOUS

## 2023-02-01 MED ORDER — CEFAZOLIN SODIUM-DEXTROSE 2-4 GM/100ML-% IV SOLN
2.0000 g | INTRAVENOUS | Status: AC
  Administered 2023-02-01: 2 g via INTRAVENOUS

## 2023-02-01 MED ORDER — FENTANYL CITRATE (PF) 100 MCG/2ML IJ SOLN
INTRAMUSCULAR | Status: DC | PRN
  Administered 2023-02-01: 100 ug via INTRAVENOUS

## 2023-02-01 MED ORDER — DEXAMETHASONE SODIUM PHOSPHATE 4 MG/ML IJ SOLN
INTRAMUSCULAR | Status: DC | PRN
  Administered 2023-02-01: 8 mg via INTRAVENOUS

## 2023-02-01 MED ORDER — HYDROCODONE-ACETAMINOPHEN 5-325 MG PO TABS: 1.0000 | ORAL_TABLET | ORAL | 0 refills | Status: DC | PRN

## 2023-02-01 MED ORDER — PROPOFOL 10 MG/ML IV BOLUS
INTRAVENOUS | Status: DC | PRN
  Administered 2023-02-01: 250 mg via INTRAVENOUS

## 2023-02-01 MED ORDER — LIDOCAINE HCL (CARDIAC) PF 100 MG/5ML IV SOSY
PREFILLED_SYRINGE | INTRAVENOUS | Status: DC | PRN
  Administered 2023-02-01: 100 mg via INTRATRACHEAL

## 2023-02-01 SURGICAL SUPPLY — 33 items
ADPR IRR PORT MULTIBAG TUBE (MISCELLANEOUS) ×1
APL PRP STRL LF DISP 70% ISPRP (MISCELLANEOUS) ×1
BLADE SURG SZ11 CARB STEEL (BLADE) ×1 IMPLANT
BNDG CMPR 5X4 CHSV STRCH STRL (GAUZE/BANDAGES/DRESSINGS) ×1
BNDG COHESIVE 4X5 TAN STRL LF (GAUZE/BANDAGES/DRESSINGS) ×1 IMPLANT
CHLORAPREP W/TINT 26 (MISCELLANEOUS) ×1 IMPLANT
COOLER POLAR GLACIER W/PUMP (MISCELLANEOUS) ×1 IMPLANT
COVER LIGHT HANDLE UNIVERSAL (MISCELLANEOUS) ×2 IMPLANT
CUFF TOURN SGL QUICK 30 (TOURNIQUET CUFF)
CUFF TRNQT CYL 30X4X21-28X (TOURNIQUET CUFF) IMPLANT
DRAPE EXTREMITY T 121X128X90 (DISPOSABLE) ×1 IMPLANT
DRAPE IMP U-DRAPE 54X76 (DRAPES) ×1 IMPLANT
GAUZE SPONGE 4X4 12PLY STRL (GAUZE/BANDAGES/DRESSINGS) ×1 IMPLANT
GLOVE SRG 8 PF TXTR STRL LF DI (GLOVE) ×1 IMPLANT
GLOVE SURG ENC MOIS LTX SZ7.5 (GLOVE) ×1 IMPLANT
GLOVE SURG UNDER POLY LF SZ8 (GLOVE) ×1
GOWN STRL REUS W/ TWL LRG LVL3 (GOWN DISPOSABLE) ×1 IMPLANT
GOWN STRL REUS W/TWL LRG LVL3 (GOWN DISPOSABLE) ×1
IV LACTATED RINGER IRRG 3000ML (IV SOLUTION) ×2
IV LR IRRIG 3000ML ARTHROMATIC (IV SOLUTION) ×2 IMPLANT
KIT TURNOVER KIT A (KITS) ×1 IMPLANT
MANIFOLD NEPTUNE II (INSTRUMENTS) ×1 IMPLANT
MAT ABSORB FLUID 56X50 GRAY (MISCELLANEOUS) ×1 IMPLANT
PACK ARTHROSCOPY KNEE (MISCELLANEOUS) ×1 IMPLANT
PAD ABD DERMACEA PRESS 5X9 (GAUZE/BANDAGES/DRESSINGS) ×1 IMPLANT
PAD WRAPON POLAR KNEE (MISCELLANEOUS) ×1 IMPLANT
SET Y ADAPTER MULIT-BAG IRRIG (MISCELLANEOUS) IMPLANT
SUT ETHILON 3-0 (SUTURE) IMPLANT
TOWEL OR 17X26 4PK STRL BLUE (TOWEL DISPOSABLE) ×2 IMPLANT
TUBING INFLOW SET DBFLO PUMP (TUBING) ×1 IMPLANT
TUBING OUTFLOW SET DBLFO PUMP (TUBING) ×1 IMPLANT
WAND WEREWOLF FLOW 90D (MISCELLANEOUS) ×1 IMPLANT
WRAPON POLAR PAD KNEE (MISCELLANEOUS) ×1

## 2023-02-01 NOTE — Anesthesia Postprocedure Evaluation (Signed)
Anesthesia Post Note  Patient: Wash Mullaly Mandarino  Procedure(s) Performed: Left knee arthroscopic partial medial meniscectomy (Left: Knee)  Patient location during evaluation: PACU Anesthesia Type: General Level of consciousness: awake and alert Pain management: pain level controlled Vital Signs Assessment: post-procedure vital signs reviewed and stable Respiratory status: spontaneous breathing, nonlabored ventilation, respiratory function stable and patient connected to nasal cannula oxygen Cardiovascular status: blood pressure returned to baseline and stable Postop Assessment: no apparent nausea or vomiting Anesthetic complications: no   No notable events documented.   Last Vitals:  Vitals:   02/01/23 0855 02/01/23 0900  BP:  (!) 124/94  Pulse: 80 99  Resp: 11 10  Temp:  36.7 C  SpO2: 97% 98%    Last Pain:  Vitals:   02/01/23 0850  TempSrc:   PainSc: 5                  Drako Maese C Nalia Honeycutt

## 2023-02-01 NOTE — Op Note (Signed)
Operative Note    SURGERY DATE: 02/01/2023   PRE-OP DIAGNOSIS:  1. Left medial meniscus tear 2. Left medial compartment degenerative changes   POST-OP DIAGNOSIS:  1. Left medial meniscus tear 2. Left medial compartment degenerative changes   PROCEDURES:  1.  Left knee arthroscopy, partial medial meniscectomy 2.  Left knee arthroscopic chondroplasty of the medial compartment   SURGEON: Rosealee Albee, MD   ANESTHESIA: Gen   ESTIMATED BLOOD LOSS: minimal   TOTAL IV FLUIDS: per anesthesia   INDICATION(S):  Matthew [name] is a 57 y.o. male with signs and symptoms as well as MRI finding of medial meniscus tear. After discussion of risks, benefits, and alternatives to surgery, the patient elected to proceed.   OPERATIVE FINDINGS:    Examination under anesthesia: A careful examination under anesthesia was performed.  Passive range of motion was: Hyperextension: 2.  Extension: 0.  Flexion: 130.  Lachman: normal. Pivot Shift: normal.  Posterior drawer: normal.  Varus stability in full extension: normal.  Varus stability in 30 degrees of flexion: normal.  Valgus stability in full extension: normal.  Valgus stability in 30 degrees of flexion: normal.   Intra-operative findings: A thorough arthroscopic examination of the knee was performed.  The findings are: 1. Suprapatellar pouch: Normal 2. Undersurface of median ridge: Grade 1 softening 3. Medial patellar facet: Grade 1 softening 4. Lateral patellar facet: Grade 1 softening 5. Trochlea: Focal grade 1-2 degenerative changes centrally 6. Lateral gutter/popliteus tendon: Normal 7. Hoffa's fat pad: Inflamed 8. Medial gutter/plica: Normal 9. ACL: Normal 10. PCL: Normal 11. Medial meniscus: Complex tear with primarily horizontal tear pattern at the posterior horn/body junction involving the full-width of the meniscus with small parrot-beak component that had flipped into the medial gutter. 12. Medial compartment cartilage: Large area  of Grade 3 degenerative changes to the medial femoral condyle; scattered grade 1-2 degenerative changes to the tibial plateau 13. Lateral meniscus: Normal 14. Lateral compartment cartilage: Grade 1 degenerative changes to the tibial plateau; normal lateral femoral condyle   OPERATIVE REPORT:     I identified Matthew [name] in the pre-operative holding area. I marked the operative knee with my initials. I reviewed the risks and benefits of the proposed surgical intervention and the patient wished to proceed. The patient was transferred to the operative suite and placed in the supine position with all bony prominences padded.  Anesthesia was administered. Appropriate IV antibiotics were administered prior to incision. The extremity was then prepped and draped in standard fashion. A time out was performed confirming the correct extremity, correct patient, and correct procedure.   Arthroscopy portals were marked. Local anesthetic was injected to the planned portal sites. The anterolateral portal was established with an 11 blade.      The arthroscope was placed in the anterolateral portal and then into the suprapatellar pouch. Next, the medial portal was established under needle localization. A diagnostic knee scope was completed with the above findings. The medial meniscus tear was identified.   The MCL was pie-crusted to improve visualization of the posterior horn. The meniscal tear was debrided using an arthroscopic biter and an oscillating shaver until the meniscus had stable borders. This involved resection of the flipped fragment and then resection of the inferior leaflet at the posterior horn/body junction. This was a full width horizontal tear. A gentle chondroplasty was performed of the medial compartment such that there were stable cartilage edges without any loose fragments of cartilage. Arthroscopic fluid was removed from the joint.  The portals were closed with 3-0 Nylon suture. Sterile  dressings included Xeroform, 4x4s, Sof-Rol, and Bias wrap. A Polarcare was placed.  The patient was then awakened and taken to the PACU hemodynamically stable without complication.   POSTOPERATIVE PLAN: The patient will be discharged home today once they meet PACU criteria. Aspirin 325 mg daily was prescribed for 2 weeks for DVT prophylaxis.  Physical therapy will start on POD#3-4. Weight-bearing as tolerated. Follow up in 2 weeks per protocol.

## 2023-02-01 NOTE — H&P (Signed)
Paper H&P to be scanned into permanent record. H&P reviewed. No significant changes noted.  

## 2023-02-01 NOTE — Discharge Instructions (Signed)
Arthroscopic Knee Surgery - Partial Meniscectomy   Post-Op Instructions   1. Bracing or crutches: Crutches will be provided at the time of discharge from the surgery center if you do not already have them.   2. Ice: You may be provided with a device (Polar Care) that allows you to ice the affected area effectively. Otherwise you can ice manually.    3. Driving:  Plan on not driving for at least two weeks. Please note that you are advised NOT to drive while taking narcotic pain medications as you may be impaired and unsafe to drive.   4. Activity: Ankle pumps several times an hour while awake to prevent blood clots. Weight bearing: as tolerated. Use crutches for as needed (usually ~1 week or less) until pain allows you to ambulate without a limp. Bending and straightening the knee is unlimited. Elevate knee above heart level as much as possible for one week. Avoid standing more than 5 minutes (consecutively) for the first week.  Avoid long distance travel for 2 weeks.  5. Medications:  - You have been provided a prescription for narcotic pain medicine. After surgery, take 1-2 narcotic tablets every 4 hours if needed for severe pain.  - You may take up to 3000mg/day of tylenol (acetaminophen). You can take 1000mg 3x/day. Please check your narcotic. If you have acetaminophen in your narcotic (each tablet will be 325mg), be careful not to exceed a total of 3000mg/day of acetaminophen.  - A prescription for anti-nausea medication will be provided in case the narcotic medicine or anesthesia causes nausea - take 1 tablet every 6 hours only if nauseated.  - Take ibuprofen 800 mg every 8 hours WITH food to reduce post-operative knee swelling. DO NOT STOP IBUPROFEN POST-OP UNTIL INSTRUCTED TO DO SO at first post-op office visit (10-14 days after surgery). However, please discontinue if you have any abdominal discomfort after taking this.  - Take enteric coated aspirin 325 mg once daily for 2 weeks to prevent  blood clots.    6. Bandages: The physical therapist should change the bandages at the first post-op appointment. If needed, the dressing supplies have been provided to you.   7. Physical Therapy: 1-2 times per week for 6 weeks. Therapy typically starts on post operative Day 3 or 4. You have been provided an order for physical therapy. The therapist will provide home exercises.   8. Work: May do light duty/desk job in approximately 1-2 weeks when off of narcotics, pain is well-controlled, and swelling has decreased. Labor intensive jobs may require 4-6 weeks to return.      9. Post-Op Appointments: Your first post-op appointment will be with Dr. Merlina Marchena in approximately 2 weeks time.    If you find that they have not been scheduled please call the Orthopaedic Appointment front desk at [phone].  

## 2023-02-01 NOTE — Transfer of Care (Signed)
Immediate Anesthesia Transfer of Care Note  Patient: Matthew [name]  Procedure(s) Performed: Left knee arthroscopic partial medial meniscectomy (Left: Knee)  Patient Location: PACU  Anesthesia Type: General  Level of Consciousness: awake, alert  and patient cooperative  Airway and Oxygen Therapy: Patient Spontanous Breathing and Patient connected to supplemental oxygen  Post-op Assessment: Post-op Vital signs reviewed, Patient's Cardiovascular Status Stable, Respiratory Function Stable, Patent Airway and No signs of Nausea or vomiting  Post-op Vital Signs: Reviewed and stable  Complications: No notable events documented.

## 2023-02-01 NOTE — Anesthesia Procedure Notes (Signed)
Procedure Name: LMA Insertion Date/Time: 02/01/2023 7:40 AM  Performed by: Domenic Moras, CRNAPre-anesthesia Checklist: Patient identified, Emergency Drugs available, Suction available and Patient being monitored Patient Re-evaluated:Patient Re-evaluated prior to induction Oxygen Delivery Method: Circle system utilized Preoxygenation: Pre-oxygenation with 100% oxygen Induction Type: IV induction Ventilation: Mask ventilation without difficulty LMA: LMA inserted LMA Size: 5.0 Number of attempts: 1 Airway Equipment and Method: Oral airway Placement Confirmation: positive ETCO2 and breath sounds checked- equal and bilateral Tube secured with: Tape Dental Injury: Teeth and Oropharynx as per pre-operative assessment

## 2023-02-04 ENCOUNTER — Encounter: Payer: Self-pay | Admitting: Orthopedic Surgery

## 2023-02-13 NOTE — Progress Notes (Signed)
 Established Patient Visit   Chief Complaint: No chief complaint on file.  Date of Service: 01/30/2023 Date of Birth: 1965/11/27 PCP: Buren Rock Browning, MD  History of Present Illness: [name] is a 57 y.o.male patient who returns for   1.  Recurrent syncope  2.  History of CAD, status post cardiac catheterization 2003 and 2005  3.  Hyperlipidemia  4.  Questionable history of prior stroke 2013  5.  Obstructive sleep apnea  The patient was seen on 05/16/2017 following syncope on 03/29/2017, and recurrent syncope 05/07/2017.  Neurological workup including MRI of the brain was unremarkable.  24-hour Holter monitor revealed predominant normal sinus rhythm with a mean heart rate of 80 bpm, infrequent premature ventricular contractions and premature atrial contractions, a 5 beat atrial run, and a 3 beat ventricular run.  Stress echocardiogram was performed 06/04/2017.  The patient exercised 12 minutes on a Bruce protocol without chest pain or ECG changes.  At baseline 2D echocardiogram revealed normal left ventricular function with LV ejection fraction greater than 55%.  At peak exercise there was appropriate augmentation of all myocardial segments without evidence for scar or ischemia. The patient underwent successful Linq implantation on 06/19/2017.  Sleep study 06/17/2017 revealed findings consistent with moderate to severe obstructive sleep apnea.  The patient returns today for a follow-up and for preoperative cardiovascular evaluation prior to left knee arthroscopic partial medial meniscectomy, scheduled for 2 days from now, 02/01/2023. The patient reports that he has been doing well without chest pain, shortness of breath, palpitations, peripheral edema, presyncope or syncope. Prior to his knee injury, he was jogging regularly about 1 year ago. ECG today show sinus rhythm at a rate of 63 bpm without acute ST/T wave abnormalities.   He underwent ETT Myoview 06/17/2019, exercised 12 minutes on a  Bruce protocol without chest pain or ischemic ECG changes.  Gated scintigraphy revealed LV ejection fraction of 59%.  SPECT analysis did not reveal evidence for scar or ischemia.  The patient has hyperlipidemia, on atorvastatin, which is well-tolerated without apparent side effects, followed by his primary care provider.    Past Medical and Surgical History  Past Medical History Past Medical History:  Diagnosis Date  . CAD (coronary artery disease)   . External hemorrhoids 02/28/2015  . GERD (gastroesophageal reflux disease) 08/2008  . Hyperlipidemia   . Hypertension 03/2017  . Internal hemorrhoids 02/28/2015  . Sleep apnea, obstructive 06/26/2017  . Stroke (CMS/HHS-HCC)   . Syncope and collapse 03/2017  . Tubular adenoma of colon 02/28/2015    Past Surgical History He has a past surgical history that includes Colonoscopy (02/28/2015); Inguinal hernia repair; Cardiac catheterization (08/2003); linq implant (2019); Colonoscopy (05/01/2021); and egd (05/01/2021).   Medications and Allergies  Current Medications  Current Outpatient Medications  Medication Sig Dispense Refill  . atorvastatin (LIPITOR) 40 MG tablet Take 1 tablet (40 mg total) by mouth once daily    . cetirizine (ZYRTEC) 10 MG chewable tablet Take 10 mg by mouth once daily    . fluticasone (FLONASE) 50 mcg/actuation nasal spray Place 2 sprays into both nostrils once daily    . metoprolol succinate (TOPROL-XL) 25 MG XL tablet Take 1 tablet (25 mg total) by mouth once daily 30 tablet 11  . montelukast (SINGULAIR) 10 mg tablet Take 10 mg by mouth once daily    . naproxen  (NAPROSYN ) 500 MG tablet Take by mouth    . omeprazole (PRILOSEC) 20 MG DR capsule Take 20 mg by mouth once daily    .  topiramate (TOPAMAX) 50 MG tablet Take 1 tablet (50 mg total) by mouth nightly (Patient taking differently: Take 50 mg by mouth 2 (two) times daily) 90 tablet 3  . isosorbide mononitrate (IMDUR) 30 MG ER tablet Take 1 tablet (30 mg total) by  mouth once daily 30 tablet 11   No current facility-administered medications for this visit.    Allergies: Patient has no known allergies.  Social and Family History  Social History  reports that he quit smoking about 11 years ago. His smoking use included cigarettes. He started smoking about 43 years ago. He has a 32 pack-year smoking history. He quit smokeless tobacco use about 18 years ago. He reports that he does not drink alcohol and does not use drugs.  Family History Family History  Problem Relation Name Age of Onset  . Myocardial Infarction (Heart attack) Brother Koren   . Coronary Artery Disease (Blocked arteries around heart) Brother Koren   . COPD Brother Koren   . Sleep disorder Paternal Aunt      Review of Systems   Review of Systems: The patient denies chest pain, shortness of breath, orthopnea, paroxysmal nocturnal dyspnea, pedal edema, palpitations, without heart racing, presyncope, syncope, with knee pain. Review of 12 Systems is negative except as described above.  Physical Examination   Vitals:BP (!) 138/96   Pulse 74   Ht 180.3 cm (5' 11)   Wt 98.4 kg (217 lb)   SpO2 97%   BMI 30.27 kg/m  Ht:180.3 cm (5' 11) Wt:98.4 kg (217 lb) ADJ:Anib surface area is 2.22 meters squared. Body mass index is 30.27 kg/m.  General: Alert and oriented. Well-appearing. No acute distress. HEENT: Pupils equally reactive to light and accomodation    Neck: Supple, no JVD Lungs: Normal effort of breathing; clear to auscultation bilaterally; no wheezes, rales, rhonchi Heart: Regular rate and rhythm. No murmur, rub, or gallop Abdomen: nondistended, with normal bowel sounds Extremities: no edema Peripheral Pulses: 2+ radial  Skin: Warm, dry, no diaphoresis  Assessment   57 y.o. male with  1. Preop examination    57 year old gentleman with undocumented history of coronary artery disease, questionable history of prior stroke, with 2 syncopal episodes, with unremarkable MRI of  the brain, unremarkable 24-hour Holter monitor, and normal stress echocardiogram.  The patient underwent successful implantation of Linq monitor device.  Home sleep study was consistent with moderate to severe obstructive sleep apnea with history of generalized fatigue and excessive daytime sleepiness. The patient reports improvement in his symptoms overall after continued use of his CPAP. He denies recurrence of presyncope or syncope.  Linq interrogation has not revealed bradycardia, tachycardia, or atrial fibrillation episodes.  ETT Myoview 06/17/2019 revealed normal left ventricular function, without evidence for scar or ischemia. The patient has been doing well from a cardiovascular standpoint with no chest pain, shortness of breath, peripheral edema, or recurrent syncope. He is an appropriate, low risk surgical candidate for upcoming knee surgery.  Plan   1.  Continue current medications 2.  Continue atorvastatin 3.  Continue regular exercise when able 4.  Recommend Mediterranean Diet 5.  Proceed with knee surgery 6.  Return to clinic for follow-up in 9 months  Orders Placed This Encounter  Procedures  . ECG 12-lead    Return in about 9 months (around 10/30/2023).  Attestation Statement:   I personally performed the service, non-incident to. (WP)   ANNA MARIA DRANE, PA-C

## 2023-06-01 ENCOUNTER — Emergency Department: Payer: Managed Care, Other (non HMO)

## 2023-06-01 ENCOUNTER — Emergency Department
Admission: EM | Admit: 2023-06-01 | Discharge: 2023-06-01 | Disposition: A | Discharge status: Home / Self Care | Payer: Managed Care, Other (non HMO) | Attending: Emergency Medicine | Admitting: Emergency Medicine

## 2023-06-01 ENCOUNTER — Other Ambulatory Visit: Payer: Self-pay

## 2023-06-01 DIAGNOSIS — W231XXA Caught, crushed, jammed, or pinched between stationary objects, initial encounter: Secondary | ICD-10-CM | POA: Insufficient documentation

## 2023-06-01 DIAGNOSIS — S93601A Unspecified sprain of right foot, initial encounter: Secondary | ICD-10-CM | POA: Diagnosis not present

## 2023-06-01 DIAGNOSIS — I10 Essential (primary) hypertension: Secondary | ICD-10-CM | POA: Insufficient documentation

## 2023-06-01 DIAGNOSIS — I2510 Atherosclerotic heart disease of native coronary artery without angina pectoris: Secondary | ICD-10-CM | POA: Diagnosis not present

## 2023-06-01 DIAGNOSIS — S99921A Unspecified injury of right foot, initial encounter: Secondary | ICD-10-CM | POA: Diagnosis present

## 2023-06-01 MED ORDER — HYDROCODONE-ACETAMINOPHEN 5-325 MG PO TABS
1.0000 | ORAL_TABLET | Freq: Four times a day (QID) | ORAL | 0 refills | Status: AC | PRN
Start: 2023-06-01 — End: 2024-05-31

## 2023-06-01 NOTE — ED Notes (Signed)
Pt A&O x4, no obvious distress noted, respirations regular/unlabored. Pt verbalizes understanding of discharge instructions. Pt able to ambulate from ED independently.
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
## 2023-06-01 NOTE — Discharge Instructions (Addendum)
Follow-up with [facility].  Dr. Jamse Arn is on call for their group and you can ask about an appointment at their [facility] office in [place].  Ice and elevation to reduce swelling and help with pain.  A prescription for hydrocodone was sent to the pharmacy for you to take as needed for moderate to severe pain.  Do not drive or operate machinery while taking this medication.  Continue taking your ibuprofen for inflammation.  Wear the cam walker for protection and support.

## 2024-01-14 NOTE — H&P (Signed)
 GI PRE-PROCEDURE HISTORY AND PHYSICAL   Matthew [name] presents for the following scheduled GI procedure(s):  Colonoscopy  surveillance -  Personal history of non-advanced adenoma(s). The last colonoscopy procedure was performed:  9 years ago (poor prep in 2022)   Scheduled Procedure(s): SURVEILLANCE COLONOSCOPY, FLEXIBLE; DIAGNOSTIC, INCLUDING COLLECTION OF SPECIMEN(S) BY BRUSHING OR WASHING, WHEN PERFORMED (SEPARATE PROCEDURE).   The indication for the procedure(s) is Colon cancer screening [Z12.11].     I have reviewed the patient's history and physical and I have re-examined the patient prior to the procedure. There have been no significant recent changes in the patient's medical status.  History: Past Medical History:  Diagnosis Date  . CAD (coronary artery disease)   . External hemorrhoids 02/28/2015  . GERD (gastroesophageal reflux disease) 08/2008  . Hyperlipidemia   . Hypertension 03/2017  . Internal hemorrhoids 02/28/2015  . Sleep apnea, obstructive 06/26/2017  . Stroke (CMS/HHS-HCC)   . Syncope and collapse 03/2017  . Tubular adenoma of colon 02/28/2015    Past Surgical History:  Procedure Laterality Date  . CARDIAC CATHETERIZATION  08/2003  . COLONOSCOPY  02/28/2015   Tubular adenoma of colon/Repeat 72yrs/MGR  . linq implant  2019  . COLONOSCOPY  05/01/2021   Poor colon prep/PHx CP/Repeat 19yr/TKT  . EGD  05/01/2021   Esophagitis/No repeat/TKT  . INGUINAL HERNIA REPAIR      Allergies No Known Allergies  Medications HYDROcodone -acetaminophen , acetaminophen , atorvastatin, cetirizine, fluticasone propionate, isosorbide mononitrate, lisinopriL, metoprolol SUCCinate, montelukast, naproxen , omeprazole, ondansetron , and topiramate  Recent Anticoagulant or Antiplatelet Use The patient is prescribed no previous anticoagulant or antiplatelet agents.  Prophylactic Antibiotics The patient does not require prophylactic antibiotics  Physical Examination BP 126/81    Pulse 68   Temp 36.2 C (97.2 F) (Temporal)   Resp 14   Ht 180.3 cm (5' 10.98)   Wt 98.4 kg (217 lb)   SpO2 97%   BMI 30.28 kg/m  Mental Status: normal Airway: normal oropharyngeal airway and neck mobility Lungs: unlabored  Abdomen: soft, nondistended, nontender  ASSESSMENT AND PLAN  [name] has been evaluated prior to the planned procedure and deemed appropriate to undergo a Procedure(s): SURVEILLANCE COLONOSCOPY, FLEXIBLE; DIAGNOSTIC, INCLUDING COLLECTION OF SPECIMEN(S) BY BRUSHING OR WASHING, WHEN PERFORMED (SEPARATE PROCEDURE).

## 2024-01-26 NOTE — Progress Notes (Signed)
 Subjective:    Chief Complaint  Patient presents with  . Cough    Patient states that he has been having this cough since about Monday evening. Patient has been taking Tylenol , Robitussin and Mucinex but nothing has been helping Patient states that it is a dry cough but every once in a while gets a little mucous to come up  . Flu-Like Symptoms    History was provided by the patient, spouse, and EMR. Matthew [name] is a 58 y.o., male  who presents with 1 week history of persistent now worsening sinus congestion, sore throat, chest ingestion, cough, fever, malaise.  Reports saw his PCP on Wednesday 6/4, had negative COVID and flu test, had 7 blood test, all normal.  He reports they then called him on Thursday 6/5, telling him he had a virus, told him to take Mucinex and drink fluids.  Patient presents today complaining of no improvement, worsening.  Admits to reduced appetite.  Sleeping on the recliner at this time.  No complaints of dizziness; no chest pain or shortness of breath; no nausea vomiting, diarrhea. Symptoms include: above   Patient denies: above Treatment to date: above   Past Medical History:  Diagnosis Date  . CAD (coronary artery disease)   . External hemorrhoids 02/28/2015  . GERD (gastroesophageal reflux disease) 08/2008  . Hyperlipidemia   . Hypertension 03/2017  . Internal hemorrhoids 02/28/2015  . Sleep apnea, obstructive 06/26/2017  . Stroke (CMS/HHS-HCC)   . Syncope and collapse 03/2017  . Tubular adenoma of colon 02/28/2015   The following portions of the patient's history were reviewed and updated as appropriate: allergies, current medications, past social history, and problem list.  Review of Systems A complete review of systems was performed.  Positive and pertinent negative responses are documented in the HPI, and all other systems are negative.   Objective:     Vitals:   01/26/24 1339  BP: (!) 132/94  Pulse: 80  Temp: 36.2 C (97.2 F)   TempSrc: Oral  SpO2: 96%  Weight: 97.5 kg (215 lb)  Height: 180.3 cm (5' 11)  PainSc:   6  PainLoc: Chest    General Appearance:  Well-developed, well-nourished.  Pleasant and cooperative.  Complaining of malaise.  Frequent croupy cough.  Here with wife. HEENT:  [place]/AT, PERRLA, EOMI, sclera clear, conjunctivae pink.   Mouth and throat: Posterior oropharynx moderate erythema; tongue midline. TM's: Effusions bilaterally.  Neck:  Supple, no JVD or adenopathy. Cardiovascular:  Regular rate and rhythm.  Lungs: Mild diminished, slightly coarse with decreased effort. Abdomen: Protuberant, soft, nontender, nondistended. Normoactive bowel sounds.  Back: No CVA tenderness. Extremities: No cyanosis, clubbing, or edema. Neuro:  Alert and orient x4; nonfocal.    Lab/X-ray/Treatments:   Chest x-ray-no discrete infiltrate  Xopenex neb-significant improvement, feels better.  PCP note/lab results-unavailable in EMR.  Extended respiratory viral panel-negative       Assessment:      1. Acute bacterial sinusitis -     moxifloxacin (AVELOX) 400 mg tablet; Take 1 tablet (400 mg total) by mouth once daily for 10 days  Dispense: 10 tablet; Refill: 0  2. Bronchitis with bronchospasm, unspecified -     predniSONE (DELTASONE) 20 MG tablet; Take 1 tablet (20 mg total) by mouth 2 (two) times daily for 5 days  Dispense: 10 tablet; Refill: 0 -     promethazine-dextromethorphan (PROMETHAZINE-DM) 6.25-15 mg/5 mL syrup; Take 5 mLs by mouth every 6 (six) hours as needed for Cough for up  to 7 days  Dispense: 60 mL; Refill: 0  3. Encntr for obs for susp expsr to oth biolg agents ruled out -     Extended Respiratory Viral Panel - [facility]; Future  4. Acute cough -     X-ray chest PA and lateral; Future  5. Fever, unspecified fever cause -     X-ray chest PA and lateral; Future  6. Bronchospasm -     levalbuterol (XOPENEX) 1.25 mg/3 mL nebulizer solution 1.25 mg   Versus early pneumonia.  Avelox,  guaifenesin, prednisone, Phenergan.  Fluids and rest.  Extended resp viral panel-remains pending.  Reports had negative test on 6/4.  Work note provided.  Call, return, ED if problems.  I spent a total of 50 minutes in both face-to-face and non-face-to-face activities, excluding procedures performed, for this visit on the date of this encounter 01/26/2024      Plan:   Requested Prescriptions   Signed Prescriptions Disp Refills  . moxifloxacin (AVELOX) 400 mg tablet 10 tablet 0    Sig: Take 1 tablet (400 mg total) by mouth once daily for 10 days  . predniSONE (DELTASONE) 20 MG tablet 10 tablet 0    Sig: Take 1 tablet (20 mg total) by mouth 2 (two) times daily for 5 days  . promethazine-dextromethorphan (PROMETHAZINE-DM) 6.25-15 mg/5 mL syrup 60 mL 0    Sig: Take 5 mLs by mouth every 6 (six) hours as needed for Cough for up to 7 days

## 2024-04-24 ENCOUNTER — Emergency Department: Admission: EM | Admit: 2024-04-24 | Discharge: 2024-04-24 | Disposition: A | Discharge status: Home / Self Care

## 2024-04-24 ENCOUNTER — Emergency Department

## 2024-04-24 ENCOUNTER — Other Ambulatory Visit: Payer: Self-pay

## 2024-04-24 DIAGNOSIS — R079 Chest pain, unspecified: Secondary | ICD-10-CM | POA: Insufficient documentation

## 2024-04-24 DIAGNOSIS — I2510 Atherosclerotic heart disease of native coronary artery without angina pectoris: Secondary | ICD-10-CM | POA: Insufficient documentation

## 2024-04-24 DIAGNOSIS — I10 Essential (primary) hypertension: Secondary | ICD-10-CM | POA: Diagnosis not present

## 2024-04-24 LAB — CBC
HCT: 43.6 % (ref 39.0–52.0)
Hemoglobin: 13.7 g/dL (ref 13.0–17.0)
MCH: 25.8 pg — ABNORMAL LOW (ref 26.0–34.0)
MCHC: 31.4 g/dL (ref 30.0–36.0)
MCV: 82.1 fL (ref 80.0–100.0)
Platelets: 266 K/uL (ref 150–400)
RBC: 5.31 MIL/uL (ref 4.22–5.81)
RDW: 14.2 % (ref 11.5–15.5)
WBC: 8.2 K/uL (ref 4.0–10.5)
nRBC: 0 % (ref 0.0–0.2)

## 2024-04-24 LAB — TROPONIN I (HIGH SENSITIVITY)
Troponin I (High Sensitivity): 4 ng/L (ref <18)
Troponin I (High Sensitivity): 4 ng/L (ref <18)

## 2024-04-24 LAB — BASIC METABOLIC PANEL WITH GFR
Anion gap: 8 (ref 5–15)
BUN: 17 mg/dL (ref 6–20)
CO2: 25 mmol/L (ref 22–32)
Calcium: 9.2 mg/dL (ref 8.9–10.3)
Chloride: 103 mmol/L (ref 98–111)
Creatinine, Ser: 0.93 mg/dL (ref 0.61–1.24)
GFR, Estimated: >60 mL/min (ref >60)
Glucose, Bld: 114 mg/dL — ABNORMAL HIGH (ref 70–99)
Potassium: 3.7 mmol/L (ref 3.5–5.1)
Sodium: 136 mmol/L (ref 135–145)

## 2024-04-24 MED ORDER — ONDANSETRON HCL 4 MG/2ML IJ SOLN
4.0000 mg | Freq: Once | INTRAMUSCULAR | Status: AC
  Administered 2024-04-24: 4 mg via INTRAVENOUS

## 2024-04-24 MED ORDER — ONDANSETRON HCL 4 MG/2ML IJ SOLN
INTRAMUSCULAR | Status: AC
  Filled 2024-04-24: qty 2

## 2024-04-24 MED ORDER — MORPHINE SULFATE (PF) 4 MG/ML IV SOLN
4.0000 mg | Freq: Once | INTRAVENOUS | Status: AC
  Administered 2024-04-24: 4 mg via INTRAVENOUS
  Filled 2024-04-24: qty 1

## 2024-04-24 MED ORDER — NITROGLYCERIN 0.4 MG SL SUBL
0.4000 mg | SUBLINGUAL_TABLET | SUBLINGUAL | Status: DC | PRN
  Administered 2024-04-24: 0.4 mg via SUBLINGUAL
  Filled 2024-04-24: qty 1

## 2024-04-24 MED ORDER — FAMOTIDINE IN NACL 20-0.9 MG/50ML-% IV SOLN
20.0000 mg | Freq: Once | INTRAVENOUS | Status: AC
  Administered 2024-04-24: 20 mg via INTRAVENOUS
  Filled 2024-04-24: qty 50

## 2024-04-24 NOTE — ED Notes (Signed)
 Pt endorses n/v; VO from Nicholaus, MD see [facility]

## 2024-04-24 NOTE — Discharge Instructions (Signed)
 You were seen in the emergency department for several hours of chest pain.  Workup today was reassuring.  You are offered admission but chose to return home today but I think is reasonable but should you change your mind at any time you are welcome back for reevaluation.  Please continue your regular medications.  Stick to a bland diet.  Rest and relax and try to lower your stress level.  Call your cardiology office on Monday to discuss whether you need an outpatient stress test.  Return with any acutely worsening symptoms or any other emergency.  It was very nice meeting you and I wish you the best of luck with everything. --- RETURN PRECAUTIONS & AFTERCARE: (ENGLISH) RETURN PRECAUTIONS: Return immediately to the emergency department or see/call your doctor if you feel worse, weak or have changes in speech or vision, are short of breath, have fever, vomiting, pain, bleeding or dark stool, trouble urinating or any new issues. Return here or see/call your doctor if not improving as expected for your suspected condition. FOLLOW-UP CARE: Call your doctor and/or any doctors we referred you to for more advice and to make an appointment. Do this today, tomorrow or after the weekend. Some doctors only take PPO insurance so if you have HMO insurance you may want to contact your HMO or your regular doctor for referral to a specialist within your plan. Either way tell the doctor's office that it was a referral from the emergency department so you get the soonest possible appointment.  YOUR TEST RESULTS: Take result reports of any blood or urine tests, imaging tests and EKG's to your doctor and any referral doctor. Have any abnormal tests repeated. Your doctor or a referral doctor can let you know when this should be done. Also make sure your doctor contacts this hospital to get any test results that are not currently available such as cultures or special tests for infection and final imaging reports, which are often not  available at the time you leave the ER but which may list additional important findings that are not documented on the preliminary report. BLOOD PRESSURE: If your blood pressure was greater than 120/80 have your blood pressure rechecked within 1 to 2 weeks. MEDICATION SIDE EFFECTS: Do not drive, walk, bike, take the bus, etc. if you have received or are being prescribed any sedating medications such as those for pain or anxiety or certain antihistamines like Benadryl. If you have been give one of these here get a taxi home or have a friend drive you home. Ask your pharmacist to counsel you on potential side effects of any new medication

## 2024-04-24 NOTE — ED Triage Notes (Signed)
 Pt  comes with left sided pain that radiates to middle. Pt took 1 nitroglycerin  and 324 aspirin .  Pt states 10/10 pain and then after meds now 3/10. Pt states tightness in chest.   Pt has hx of angina. Pt has had 3 caths.

## 2024-04-24 NOTE — ED Provider Notes (Signed)
 [facility] Provider Note    Event Date/Time   First MD Initiated Contact with Patient 04/24/24 1944     (approximate)   History   Chest Pain   HPI  AWAD GLADD is a 58 y.o. male CAD, hemorrhoids, GERD, hyperlipidemia hypertension, stroke who presents with 2 hours of chest pain.  Patient states that he was in his usual state of health and was standing in the mail room when he developed chest pain.  Pain was described as a pressure in his chest and crampy.  He took 1 nitroglycerin  and 324 of aspirin  with some improvement in symptoms.  He subsequently came to the emergency department.  He does note a slight reoccurrence of his chest pain since being roomed.  Patient reports compliance with all of his medications and does see a cardiologist regularly.  He reports multiple social stressors in his life.  He reports experiencing similar pain before however it has been a while.  He presents with a supportive person at bedside      Physical Exam   Triage Vital Signs: ED Triage Vitals [04/24/24 1819]  Encounter Vitals Group     BP (!) 145/100     Girls Systolic BP Percentile      Girls Diastolic BP Percentile      Boys Systolic BP Percentile      Boys Diastolic BP Percentile      Pulse Rate 69     Resp 18     Temp 98 F (36.7 C)     Temp src      SpO2 100 %     Weight 205 lb (93 kg)     Height 5' 11 (1.803 m)     Head Circumference      Peak Flow      Pain Score 3     Pain Loc      Pain Education      Exclude from Growth Chart     Most recent vital signs: Vitals:   04/24/24 2030 04/24/24 2100  BP: 120/85 (!) 141/89  Pulse: (!) 54 (!) 53  Resp: 18 12  Temp:  98 F (36.7 C)  SpO2: 99% 99%    Nursing Triage Note reviewed. Vital signs reviewed and patients oxygen saturation is normoxic  General: Patient is well nourished, well developed, awake and alert, resting comfortably in no acute distress Head: Normocephalic and  atraumatic Eyes: Normal inspection, extraocular muscles intact, no conjunctival pallor Ear, nose, throat: Normal external exam Neck: Normal range of motion Respiratory: Patient is in no respiratory distress, lungs CTAB Cardiovascular: Patient is not tachycardic, RRR without murmur appreciated 2+ equal bilateral pulses GI: Abd SNT with no guarding or rebound  Back: Normal inspection of the back with good strength and range of motion throughout all ext Extremities: pulses intact with good cap refills, no LE pitting edema or calf tenderness Neuro: The patient is alert and oriented to person, place, and time, appropriately conversive, with 5/5 bilat UE/LE strength, no gross motor or sensory defects noted. Coordination appears to be adequate. Skin: Warm, dry, and intact Psych: normal mood and affect, no SI or HI  ED Results / Procedures / Treatments   Labs (all labs ordered are listed, but only abnormal results are displayed) Labs Reviewed  BASIC METABOLIC PANEL WITH GFR - Abnormal; Notable for the following components:      Result Value   Glucose, Bld 114 (*)    All other components within  normal limits  CBC - Abnormal; Notable for the following components:   MCH 25.8 (*)    All other components within normal limits  TROPONIN I (HIGH SENSITIVITY)  TROPONIN I (HIGH SENSITIVITY)     EKG EKG and rhythm strip are interpreted by myself:   EKG: [Normal sinus rhythm] at heart rate of 72, normal QRS duration, QTc 438, normal ST segments and T waves with PACs EKG not consistent with Acute STEMI Rhythm strip: NSR in lead II   RADIOLOGY Xray chest: No acute abnormality on my independent review interpretation and radiologist agrees.  Patient does have a loop recorder in place    PROCEDURES:  Critical Care performed: No  Procedures   MEDICATIONS ORDERED IN ED: Medications  nitroGLYCERIN  (NITROSTAT ) SL tablet 0.4 mg (0.4 mg Sublingual Given 04/24/24 2011)  famotidine  (PEPCID ) IVPB  20 mg premix (0 mg Intravenous Stopped 04/24/24 2051)  morphine  (PF) 4 MG/ML injection 4 mg (4 mg Intravenous Given 04/24/24 2012)  ondansetron  (ZOFRAN ) injection 4 mg ( Intravenous Not Given 04/24/24 2037)     IMPRESSION / MDM / ASSESSMENT AND PLAN / ED COURSE           HEART Score: 4                     Differential diagnosis includes, but is not limited to, ACS, musculoskeletal, GERD, pneumonia, arrhythmia, anemia, electrolyte derangement   ED course: Patient presents and reassured that his EKG demonstrates no evidence of acute ischemia.  He has no focal neurological deficits and pain does not seem consistent with dissection.  Chest x-ray demonstrated no pneumonia.  He had no anemia or profound electrolyte derangements.  He did receive Pepcid  morphine  Zofran  and a sublingual nitro with good resolve of his symptoms.  Troponins x 2 were not elevated.  His heart score was 4.  I reviewed the indications benefits risk and alternatives of possible admission with the patient and he voiced understanding but would prefer to return home given his negative troponin which he states was reassuring for him.  I asked him to call his cardiology office for [facility] on Monday or return with any acutely worsening symptoms and he voiced understanding and willingness to do such   Clinical Course as of 04/24/24 2340  Fri Apr 24, 2024  1944 Troponin I (High Sensitivity): 4 Not elevated [HD]  2059 Troponin I (High Sensitivity): 4 Not elevated [HD]  2104 Patient reevaluated after second troponin and feels much improved.  I did offer the patient admission today to touch base of cardiology in the morning however patient would prefer to return home.  I counseled him that at any time should his pain acutely worsen and he feels uncomfortable he can return for reevaluation.  He and his support person at bedside are in agreement with home right now.  Return precautions given and he will follow-up as an outpatient with cardiology  [HD]    Clinical Course User Index [HD] Nicholaus Rolland BRAVO, MD   At time of discharge there is no evidence of acute life, limb, vision, or fertility threat. Patient has stable vital signs, pain is well controlled, patient is ambulatory and p.o. tolerant.  Discharge instructions were completed using the [employer] system. I would refer you to those at this time. All warnings prescriptions follow-up etc. were discussed in detail with the patient. Patient indicates understanding and is agreeable with this plan. All questions answered.  Patient is made aware that they may return  to the emergency department for any worsening or new condition or for any other emergency.   FINAL CLINICAL IMPRESSION(S) / ED DIAGNOSES   Final diagnoses:  Chest pain, unspecified type     Rx / DC Orders   ED Discharge Orders          Ordered    Ambulatory referral to Cardiology       Comments: If you have not heard from the Cardiology office within the next 72 hours please call [phone].   04/24/24 2105             Note:  This document was prepared using Dragon voice recognition software and may include unintentional dictation errors.   Nicholaus Rolland BRAVO, MD 04/24/24 [phone]

## 2024-04-24 NOTE — ED Notes (Signed)
 While sitting in lobby, pt reports that his chest pain has increased to 10/10 midline sternal area. He says that his pain did get better after the Nitroglycerin  he took at home (4/10), but now increased. Brought pt to treatment room, placed on monitor, notified primary RN and EDP

## 2024-05-18 ENCOUNTER — Ambulatory Visit

## 2024-05-26 ENCOUNTER — Other Ambulatory Visit: Payer: Self-pay | Admitting: Medical Genetics

## 2024-05-27 ENCOUNTER — Other Ambulatory Visit

## 2024-05-29 ENCOUNTER — Other Ambulatory Visit

## 2024-09-15 ENCOUNTER — Other Ambulatory Visit: Payer: Self-pay | Admitting: Medical Genetics

## 2024-09-15 DIAGNOSIS — Z006 Encounter for examination for normal comparison and control in clinical research program: Secondary | ICD-10-CM
